# Patient Record
Sex: Female | Born: 1975 | Hispanic: No | Smoking: Never smoker
Health system: Southern US, Community
[De-identification: ages and names within clinical notes are randomized; demographics above are authoritative.]

## PROBLEM LIST (undated history)

## (undated) DIAGNOSIS — E119 Type 2 diabetes mellitus without complications: Secondary | ICD-10-CM

## (undated) DIAGNOSIS — F329 Major depressive disorder, single episode, unspecified: Secondary | ICD-10-CM

## (undated) DIAGNOSIS — K219 Gastro-esophageal reflux disease without esophagitis: Secondary | ICD-10-CM

## (undated) DIAGNOSIS — G4733 Obstructive sleep apnea (adult) (pediatric): Principal | ICD-10-CM

## (undated) DIAGNOSIS — E669 Obesity, unspecified: Secondary | ICD-10-CM

## (undated) DIAGNOSIS — G4730 Sleep apnea, unspecified: Secondary | ICD-10-CM

## (undated) DIAGNOSIS — F32A Depression, unspecified: Secondary | ICD-10-CM

## (undated) DIAGNOSIS — F419 Anxiety disorder, unspecified: Secondary | ICD-10-CM

## (undated) HISTORY — DX: Type 2 diabetes mellitus without complications: E11.9

## (undated) HISTORY — DX: Obstructive sleep apnea (adult) (pediatric): G47.33

## (undated) HISTORY — DX: Sleep apnea, unspecified: G47.30

## (undated) HISTORY — DX: Obesity, unspecified: E66.9

---

## 2013-11-30 ENCOUNTER — Encounter (INDEPENDENT_AMBULATORY_CARE_PROVIDER_SITE_OTHER): Payer: Self-pay | Admitting: Surgery

## 2013-11-30 ENCOUNTER — Ambulatory Visit (INDEPENDENT_AMBULATORY_CARE_PROVIDER_SITE_OTHER): Payer: BC Managed Care – PPO | Admitting: Surgery

## 2013-11-30 ENCOUNTER — Other Ambulatory Visit (INDEPENDENT_AMBULATORY_CARE_PROVIDER_SITE_OTHER): Payer: Self-pay

## 2013-11-30 LAB — COMPREHENSIVE METABOLIC PANEL
ALBUMIN: 3.6 g/dL (ref 3.5–5.2)
ALT: 12 U/L (ref 0–35)
AST: 14 U/L (ref 0–37)
Alkaline Phosphatase: 84 U/L (ref 39–117)
BUN: 14 mg/dL (ref 6–23)
CALCIUM: 8.7 mg/dL (ref 8.4–10.5)
CO2: 25 meq/L (ref 19–32)
Chloride: 101 mEq/L (ref 96–112)
Creat: 0.86 mg/dL (ref 0.50–1.10)
Glucose, Bld: 76 mg/dL (ref 70–99)
Potassium: 4.1 mEq/L (ref 3.5–5.3)
SODIUM: 136 meq/L (ref 135–145)
TOTAL PROTEIN: 6.8 g/dL (ref 6.0–8.3)
Total Bilirubin: 0.3 mg/dL (ref 0.2–1.2)

## 2013-11-30 LAB — CBC WITH DIFFERENTIAL/PLATELET
Basophils Absolute: 0 10*3/uL (ref 0.0–0.1)
Basophils Relative: 0 % (ref 0–1)
Eosinophils Absolute: 0.1 10*3/uL (ref 0.0–0.7)
Eosinophils Relative: 2 % (ref 0–5)
HCT: 31.1 % — ABNORMAL LOW (ref 36.0–46.0)
HEMOGLOBIN: 9.6 g/dL — AB (ref 12.0–15.0)
LYMPHS ABS: 2.1 10*3/uL (ref 0.7–4.0)
Lymphocytes Relative: 32 % (ref 12–46)
MCH: 23.4 pg — AB (ref 26.0–34.0)
MCHC: 30.9 g/dL (ref 30.0–36.0)
MCV: 75.9 fL — ABNORMAL LOW (ref 78.0–100.0)
MONOS PCT: 8 % (ref 3–12)
Monocytes Absolute: 0.5 10*3/uL (ref 0.1–1.0)
NEUTROS PCT: 58 % (ref 43–77)
Neutro Abs: 3.9 10*3/uL (ref 1.7–7.7)
Platelets: 417 10*3/uL — ABNORMAL HIGH (ref 150–400)
RBC: 4.1 MIL/uL (ref 3.87–5.11)
RDW: 18 % — ABNORMAL HIGH (ref 11.5–15.5)
WBC: 6.7 10*3/uL (ref 4.0–10.5)

## 2013-11-30 LAB — TSH: TSH: 2.508 u[IU]/mL (ref 0.350–4.500)

## 2013-11-30 NOTE — Progress Notes (Addendum)
Re:   Paige CitizenChristy J. Anderson DOB:   11-26-1975 MRN:   [id number]  ASSESSMENT AND PLAN: 1.  Morbid obesity  Initial weight - 248, BMI - 48.4  Per the 1991 NIH Consensus Statement, the patient is a candidate for bariatric surgery.  The patient attended our initial information session and reviewed the types of bariatric surgery.    The patient is interested in the Roux en Y Gastric Bypass.  I discussed with the patient the indications and risks of bariatric surgery.  The potential risks of surgery include, but are not limited to, bleeding, infection, leak from the bowel, DVT and PE, open surgery, long term nutrition consequences, and death.  The patient understands the importance of compliance and long term follow-up with our group after surgery.  From here we will obtain lab tests, x-rays, nutrition consult, and psych consult. She has a good handle on this because of her mother.  2.  GERD 3.  DM x 1 year  HgbA1C - 6.6 on 08/08/2013 4.  Possible sleep apnea. 5.  Colonic diverticula on CT scan of 07/01/2102 [Has severe sleep apnea - saw Dr. Craige CottaSood on 02/01/2014 - for CPAP  DN 02/04/2014]  Chief Complaint  Patient presents with  . Bariatric Pre-op   REFERRING PHYSICIAN: Bosie ClosICE,Paige M, MD  HISTORY OF PRESENT ILLNESS: Paige Anderson is a 38 y.o. (DOB: 11-26-1975)  white  female whose primary care physician is Bosie ClosICE,Paige M, MD and comes to me today for weight loss surgery. She comes with her mother, Paige Anderson (#[id number], a RYGB patient of mine).  I did her mother's surgery just about 1 month ago.  Ms. Vale HavenBarney has been to an information session that I spoke at.  She has tried multiple diets: Weight Watchers, Slim fast, and low calorie. She has had success with Weight Watchers losing 40 pounds, but then she "fell off the wagon". She's also tried a diet pill that started with the letter D. Her recent diagnosis of diabetes and her mother's surgery has made her think about the surgery.   No  past medical history on file.   No past surgical history on file.    Current Outpatient Prescriptions  Medication Sig Dispense Refill  . omeprazole (PRILOSEC) 40 MG capsule Take 40 mg by mouth daily.      . metFORMIN (GLUCOPHAGE) 500 MG tablet Take by mouth 2 (two) times daily with a meal.       No current facility-administered medications for this visit.      Allergies  Allergen Reactions  . Aspirin Rash    REVIEW OF SYSTEMS: Skin:  No history of rash.  No history of abnormal moles. Infection:  No history of hepatitis or HIV.  No history of MRSA. Neurologic:  No history of stroke.  No history of seizure.  No history of headaches. Cardiac:  No history of hypertension. No history of heart disease.   No history of seeing a cardiologist. Pulmonary:  Possible sleep apnea.  Endocrine:  DM x 1 year. HgbA1C - 6.6 on 08/08/2013.    No thyroid disease. Gastrointestinal:  GERD.   No history of liver disease.  No history of gall bladder disease.  No history of pancreas disease.  Colonic diverticula on CT scan of 07/01/2102. Urologic:  No history of kidney stones.  No history of bladder infections. Musculoskeletal:  No history of joint or back disease. Hematologic:  No bleeding disorder.  No history of anemia.  Not anticoagulated. Psycho-social:  The patient  is oriented.   The patient has no obvious psychologic or social impairment to understanding our conversation and plan.  SOCIAL and FAMILY HISTORY: Divorced She comes with her mother, Paige Loron [phone], a RYGB patient of mine) Works with insurance, [employer] Has 2 children, 1 yo daughter and 65 yo son  PHYSICAL EXAM: BP 128/76  Pulse 62  Temp(Src) 98 F (36.7 C)  Resp 18  Ht 5' (1.524 m)  Wt 248 lb (112.492 kg)  BMI 48.43 kg/m2  General: WN WF who is alert and generally healthy appearing.  HEENT: Normal. Pupils equal. Neck: Supple. No mass.  No thyroid mass. Lymph Nodes:  No supraclavicular or cervical  nodes. Lungs: Clear to auscultation and symmetric breath sounds. Heart:  RRR. No murmur or rub. Abdomen: Soft. No mass. No tenderness. No hernia. Normal bowel sounds.  Pfannenstiel scar.  She is more apple than pear. Rectal: No mass.  Guaiac neg stool. Extremities:  Good strength and ROM  in upper and lower extremities. Neurologic:  Grossly intact to motor and sensory function. Psychiatric: Has normal mood and affect. Behavior is normal.   DATA REVIEWED: Notes from Dr. Kathi Ludwig, MD,  [facility] [address] [place]  [address]   [place], [place]    [address] Phone:  [phone] FAX:  [phone]

## 2013-11-30 NOTE — Patient Instructions (Signed)
Congratulations on starting your journey to a healthier life! Over the next few weeks you will be undergoing tests (x-rays and labs) and seeing specialists to help evaluate you for weight loss surgery.  These tests and consultations with a psychologist and nutritionist are needed to prepare you for the lifestyle changes that lie ahead and are often required by insurance companies to approve you for surgery.   Pathway to Surgery:  Over the next few weeks -->Lab work -->Radiology tests   - Chest x-ray - make sure your lungs are normal before surgery  - Upper GI - you drink barium and pictures are taken as it travels down your  esophagus and into your stomach - looks for reflux and a hiatal hernia which may  need to repaired at the same time as your weight loss surgery  - Abdominal Ultrasound - looks at your gallbladder and liver  - Mammogram - up to date mammogram if you are a female -->EKG  -->Sleep study - if you are felt to be at high risk for obstructive sleep apnea -->H. Pylori breath test (BreathTek) - you surgeon may order this test to see if you have  a bacteria (H pylori) in your stomach which makes you at higher risk to develop a  ulcer or inflammation of your stomach -->Nutrition consultation -->Psychologist consultation -->Other specialist consults - your surgeon may determine that you need to see a specialist like a cardiologist or pulmnologist depending on your health history -->Watch EMMI video about your planned weight loss surgery -->you can look at [email or web address] to learn more about weight loss surgery and  compare surgery outcomes -->you can look at our new website - [email or web address] - available mid-April 2015  Two weeks prior to surgery  Go on the extremely low carb liquid diet - this will decrease the size of your liver  which will make surgery safer - the nutritionist will go over this at a later date  Attend preoperative appointment with your surgeon  Attend  preoperative surgery class  One week prior to surgery  No aspirin products.  Tylenol is acceptable   24 hours prior to surgery  No alcoholic beverages  Report fever greater than 100.5 or excessive nasal drainage suggesting infection  Continue bariatric preop diet  Perform bowel prep  Do not eat or drink anything after midnight the night before surgery  Do not take any medications except those instructed by the anesthesiologist  Morning of surgery  Please arrive at the hospital at least 2 hours before your scheduled surgery time.  No makeup, fingernail polish or jewelry  Bring insurance cards with you  Bring your CPAP mask if you use this   

## 2013-12-27 ENCOUNTER — Other Ambulatory Visit: Payer: Self-pay

## 2013-12-27 ENCOUNTER — Ambulatory Visit (HOSPITAL_COMMUNITY)
Admission: RE | Admit: 2013-12-27 | Discharge: 2013-12-27 | Disposition: A | Discharge status: Home / Self Care | Payer: BC Managed Care – PPO | Source: Ambulatory Visit | Attending: Surgery | Admitting: Surgery

## 2013-12-27 DIAGNOSIS — K5730 Diverticulosis of large intestine without perforation or abscess without bleeding: Secondary | ICD-10-CM | POA: Insufficient documentation

## 2013-12-27 DIAGNOSIS — Q619 Cystic kidney disease, unspecified: Secondary | ICD-10-CM | POA: Insufficient documentation

## 2013-12-27 DIAGNOSIS — K7689 Other specified diseases of liver: Secondary | ICD-10-CM | POA: Insufficient documentation

## 2013-12-27 DIAGNOSIS — Z6841 Body Mass Index (BMI) 40.0 and over, adult: Secondary | ICD-10-CM | POA: Insufficient documentation

## 2013-12-27 DIAGNOSIS — Q6101 Congenital single renal cyst: Secondary | ICD-10-CM | POA: Insufficient documentation

## 2013-12-27 DIAGNOSIS — E119 Type 2 diabetes mellitus without complications: Secondary | ICD-10-CM | POA: Insufficient documentation

## 2013-12-27 DIAGNOSIS — K219 Gastro-esophageal reflux disease without esophagitis: Secondary | ICD-10-CM | POA: Insufficient documentation

## 2014-01-01 ENCOUNTER — Encounter (HOSPITAL_COMMUNITY)
Admission: RE | Disposition: A | Discharge status: Home / Self Care | Payer: Self-pay | Source: Ambulatory Visit | Attending: Surgery

## 2014-01-01 ENCOUNTER — Ambulatory Visit (HOSPITAL_COMMUNITY)
Admission: RE | Admit: 2014-01-01 | Discharge: 2014-01-01 | Disposition: A | Discharge status: Home / Self Care | Payer: BC Managed Care – PPO | Source: Ambulatory Visit | Attending: Surgery | Admitting: Surgery

## 2014-01-01 HISTORY — PX: BREATH TEK H PYLORI: SHX5422

## 2014-01-01 SURGERY — BREATH TEST, FOR HELICOBACTER PYLORI

## 2014-01-01 NOTE — Progress Notes (Signed)
01/01/14 [id number]  BREATH TEK ASSESSMENT  Referring MD Ovidio Kinavid Newman  Time of Last PO Intake 2130 (on 12/31/2013)  Baseline Breath At: 0805  Pranactin Given At: 0810  Post-Dose Breath At: 0825  Sample 1 3.5  Sample 2 3.0  Test Negative

## 2014-01-01 NOTE — Progress Notes (Signed)
01/01/14 0903  BREATH TEK ASSESSMENT  Referring MD David Newman  Time of Last PO Intake 2130 (on 12/31/2013)  Baseline Breath At: 0805  Pranactin Given At: 0810  Post-Dose Breath At: 0825  Sample 1 3.5  Sample 2 3.0  Test Negative   

## 2014-01-02 ENCOUNTER — Encounter (HOSPITAL_COMMUNITY): Payer: Self-pay | Admitting: Surgery

## 2014-01-04 ENCOUNTER — Encounter (HOSPITAL_BASED_OUTPATIENT_CLINIC_OR_DEPARTMENT_OTHER): Payer: Self-pay

## 2014-01-09 ENCOUNTER — Encounter: Payer: BC Managed Care – PPO | Attending: Surgery | Admitting: Dietician

## 2014-01-09 ENCOUNTER — Encounter: Payer: Self-pay | Admitting: Dietician

## 2014-01-09 DIAGNOSIS — Z01818 Encounter for other preprocedural examination: Secondary | ICD-10-CM | POA: Diagnosis present

## 2014-01-09 DIAGNOSIS — Z6841 Body Mass Index (BMI) 40.0 and over, adult: Secondary | ICD-10-CM | POA: Diagnosis not present

## 2014-01-09 DIAGNOSIS — Z713 Dietary counseling and surveillance: Secondary | ICD-10-CM | POA: Insufficient documentation

## 2014-01-09 NOTE — Progress Notes (Signed)
  Pre-Op Assessment Visit:  Pre-Operative RYGB Surgery  Medical Nutrition Therapy:  Appt start time: 1045   End time:  1115.  Patient was seen on 01/09/2014 for Pre-Operative RYGB Nutrition Assessment. Assessment and [facility] on 01/09/2014.   Preferred Learning Style:   No preference indicated   Learning Readiness:   Ready  Handouts given during visit include:  Pre-Op Goals Bariatric Surgery Protein Shakes  Teaching Method Utilized:  Visual Auditory  Barriers to learning/adherence to lifestyle change: none  Demonstrated degree of understanding via:  Teach Back   Patient to call the [facility] to enroll in Pre-Op and Post-Op Nutrition Education when surgery date is scheduled.

## 2014-01-23 ENCOUNTER — Encounter (HOSPITAL_BASED_OUTPATIENT_CLINIC_OR_DEPARTMENT_OTHER): Payer: Self-pay

## 2014-01-23 ENCOUNTER — Ambulatory Visit (HOSPITAL_BASED_OUTPATIENT_CLINIC_OR_DEPARTMENT_OTHER): Payer: BC Managed Care – PPO | Attending: Surgery | Admitting: Radiology

## 2014-01-23 VITALS — Ht 60.0 in | Wt 250.0 lb

## 2014-01-23 DIAGNOSIS — R0609 Other forms of dyspnea: Secondary | ICD-10-CM | POA: Diagnosis not present

## 2014-01-23 DIAGNOSIS — G4733 Obstructive sleep apnea (adult) (pediatric): Secondary | ICD-10-CM | POA: Diagnosis not present

## 2014-01-23 DIAGNOSIS — R0989 Other specified symptoms and signs involving the circulatory and respiratory systems: Secondary | ICD-10-CM | POA: Insufficient documentation

## 2014-01-27 DIAGNOSIS — G4733 Obstructive sleep apnea (adult) (pediatric): Secondary | ICD-10-CM

## 2014-01-27 NOTE — Sleep Study (Signed)
   NAME: Paige Anderson DATE OF BIRTH:  1976/02/01 MEDICAL RECORD NUMBER [id number]  LOCATION: [facility]  PHYSICIAN: Doniven Vanpatten D  DATE OF STUDY: 01/23/2014  SLEEP STUDY TYPE: Nocturnal Polysomnogram               REFERRING PHYSICIAN: Ovidio Kin, MD  INDICATION FOR STUDY: Hypersomnia with sleep apnea  EPWORTH SLEEPINESS SCORE:  17/24 HEIGHT: 5' (152.4 cm)  WEIGHT: 250 lb (113.399 kg)    Body mass index is 48.82 kg/(m^2).  NECK SIZE: 19 in.  MEDICATIONS: Charted for review  SLEEP ARCHITECTURE: Total sleep time 338.5 minutes with sleep efficiency 86.5%. Stage I was 4.7%, stage II 76.5%, stage III 0.4%, REM 18.3% of total sleep time. Sleep latency 31.5 minutes, REM latency 158.5 minutes, awake after sleep onset 21.5 minutes, arousal index 40.6, bedtime medication: None  RESPIRATORY DATA: Apnea hypopneas index (AHI) 90.8 per hour. 512 events scored including 93 obstructive apneas and 419 hypopneas. Most events were while nonsupine. REM AHI 91 per hour. This study was ordered as a diagnostic polysomnogram without CPAP.  OXYGEN DATA: Moderate snoring with oxygen desaturation to a nadir of 34% and mean saturation 87% on room air. Room air oxygen saturation on arrival while awake was 99%.  CARDIAC DATA: Sinus rhythm with PACs and PVCs  MOVEMENT/PARASOMNIA: No significant movement disturbance, no bathroom trips  IMPRESSION/ RECOMMENDATION:   1) Severe obstructive sleep apnea/hypopneas syndrome, AHI 90.8 per hour with nonsupine events. REM AHI 91 per hour. Moderate snoring with oxygen desaturation to a nadir of 34% and mean saturation 87% on room air. 2) This study was ordered as a diagnostic polysomnogram. Consider return for a dedicated CPAP titration study, which would also allow assessment of oxygenation response to CPAP.   Waymon Budge Diplomate, American Board of Sleep Medicine  ELECTRONICALLY SIGNED ON:  01/27/2014, 11:11 AM [facility] PH: [phone]   FX: [phone] ACCREDITED BY THE AMERICAN ACADEMY OF SLEEP MEDICINE

## 2014-02-01 ENCOUNTER — Encounter: Payer: Self-pay | Admitting: Pulmonary Disease

## 2014-02-01 ENCOUNTER — Ambulatory Visit (INDEPENDENT_AMBULATORY_CARE_PROVIDER_SITE_OTHER): Payer: BC Managed Care – PPO | Admitting: Pulmonary Disease

## 2014-02-01 VITALS — BP 118/72 | HR 96 | Ht 60.0 in | Wt 260.2 lb

## 2014-02-01 DIAGNOSIS — G4733 Obstructive sleep apnea (adult) (pediatric): Secondary | ICD-10-CM | POA: Insufficient documentation

## 2014-02-01 HISTORY — DX: Obstructive sleep apnea (adult) (pediatric): G47.33

## 2014-02-01 NOTE — Assessment & Plan Note (Signed)
She has severe OSA with significant oxygen desaturation.  I have reviewed the recent sleep study results with the patient.  We discussed how sleep apnea can affect various health problems including risks for hypertension, cardiovascular disease, and diabetes.  We also discussed how sleep disruption can increase risks for accident, such as while driving.  Weight loss as a means of improving sleep apnea was also reviewed.  Additional treatment options discussed were CPAP therapy, oral appliance, and surgical intervention.  Will arrange for in lab titration study.  Will then determine if CPAP is okay, or if she needs BiPAP +/- supplemental oxygen.

## 2014-02-01 NOTE — Patient Instructions (Signed)
Will arrange for CPAP titration study Follow up in 3 to 4 months

## 2014-02-01 NOTE — Progress Notes (Signed)
Chief Complaint  Patient presents with  . SLEEP CONSULT    Referred by Dr Ezzard Standing. Epworth Score: 17    History of Present Illness: Paige Anderson is a 38 y.o. female for evaluation of sleep problems.  She is being assessed for bariatric surgery.  There was concern she could have sleep apnea.  This was confirmed with sleep study.  She snores, and will stop breathing while asleep.  She will wake up with a gasp.  She is tired all the time, and can fall asleep whenever she is sitting quiet.  She goes to sleep at 10 pm.  She falls asleep after 15 minutes.  She wakes up 2 to 3 times to use the bathroom.  She gets out of bed at 9.  She feels tire in the morning.  She denies morning headache.  She does not use anything to help her fall sleep.  She drinks soda all day long.  She denies sleep walking, sleep talking, bruxism, or nightmares.  There is no history of restless legs.  She denies sleep hallucinations, sleep paralysis, or cataplexy.  She will get leg cramps.  The Epworth score is 17 out of 24.  Tests: PSG 01/23/14 >> AHI 90.8, SpO2 low 34%  Paige Anderson  has a past medical history of Sleep apnea; Obesity; and Diabetes mellitus without complication.  Paige Anderson  has past surgical history that includes Breath tek h pylori (N/A, 01/01/2014) and Cesarean section.  Prior to Admission medications   Medication Sig Start Date End Date Taking? Authorizing Provider  metFORMIN (GLUCOPHAGE) 500 MG tablet Take 500 mg by mouth daily with breakfast.    Yes Historical Provider, MD  omeprazole (PRILOSEC) 40 MG capsule Take 40 mg by mouth daily.   Yes Historical Provider, MD    Allergies  Allergen Reactions  . Aspirin Rash    Her family history includes Breast cancer in her mother; Diabetes in her other; Obesity in her other.  She  reports that she has never smoked. She does not have any smokeless tobacco history on file. She reports that she drinks alcohol. She reports that she does  not use illicit drugs. Review of Systems  Constitutional: Negative for fever and unexpected weight change.  HENT: Positive for congestion and sneezing. Negative for dental problem, ear pain, nosebleeds, postnasal drip, rhinorrhea, sinus pressure, sore throat and trouble swallowing.   Eyes: Negative for redness and itching.  Respiratory: Positive for cough and shortness of breath. Negative for chest tightness and wheezing.   Cardiovascular: Negative for palpitations and leg swelling.  Gastrointestinal: Negative for nausea and vomiting.  Genitourinary: Negative for dysuria.  Musculoskeletal: Positive for arthralgias and joint swelling.  Skin: Positive for rash.  Neurological: Positive for headaches.  Hematological: Does not bruise/bleed easily.  Psychiatric/Behavioral: Negative for dysphoric mood. The patient is nervous/anxious.     Physical Exam:  General - No distress ENT - No sinus tenderness, no oral exudate, no LAN, no thyromegaly, TM clear, pupils equal/reactive, MP 4, enlarged tongue Cardiac - s1s2 regular, no murmur, pulses symmetric Chest - No wheeze/rales/dullness, good air entry, normal respiratory excursion Back - No focal tenderness Abd - Soft, non-tender, no organomegaly, + bowel sounds Ext - No edema Neuro - Normal strength, cranial nerves intact Skin - No rashes Psych - Normal mood, and behavior  Assessment/plan:  Coralyn Helling, M.D. Pager [phone]

## 2014-02-01 NOTE — Progress Notes (Deleted)
   Subjective:    Patient ID: Paige Anderson, female    DOB: [birth year], 38 y.o.   MRN: [id number]  HPI    Review of Systems  Constitutional: Negative for fever and unexpected weight change.  HENT: Positive for congestion and sneezing. Negative for dental problem, ear pain, nosebleeds, postnasal drip, rhinorrhea, sinus pressure, sore throat and trouble swallowing.   Eyes: Negative for redness and itching.  Respiratory: Positive for cough and shortness of breath. Negative for chest tightness and wheezing.   Cardiovascular: Negative for palpitations and leg swelling.  Gastrointestinal: Negative for nausea and vomiting.  Genitourinary: Negative for dysuria.  Musculoskeletal: Positive for arthralgias and joint swelling.  Skin: Positive for rash.  Neurological: Positive for headaches.  Hematological: Does not bruise/bleed easily.  Psychiatric/Behavioral: Negative for dysphoric mood. The patient is nervous/anxious.        Objective:   Physical Exam        Assessment & Plan:

## 2014-02-02 ENCOUNTER — Encounter (HOSPITAL_BASED_OUTPATIENT_CLINIC_OR_DEPARTMENT_OTHER): Payer: BC Managed Care – PPO

## 2014-02-04 NOTE — Addendum Note (Signed)
Addended by: Kandis Cocking on: 02/04/2014 02:24 PM   Modules accepted: Orders

## 2014-02-26 ENCOUNTER — Encounter: Payer: BC Managed Care – PPO | Attending: Surgery

## 2014-02-26 DIAGNOSIS — E119 Type 2 diabetes mellitus without complications: Secondary | ICD-10-CM | POA: Diagnosis not present

## 2014-02-26 DIAGNOSIS — Z713 Dietary counseling and surveillance: Secondary | ICD-10-CM | POA: Insufficient documentation

## 2014-02-26 NOTE — Progress Notes (Signed)
  Pre-Operative Nutrition Class:  Appt start time: 830   End time:  930.  Patient was seen on 02/26/2014 for Pre-Operative Bariatric Surgery Education at [facility].   Surgery date: 03/19/2014 Surgery type: RYGB [facility] weight at [facility]: 252 lbs on 01/09/2014 Weight today: 250.0 lbs  TANITA  BODY COMP RESULTS  02/26/14   BMI (kg/m^2) 48.8   Fat Mass (lbs) 128.0   Fat Free Mass (lbs) 122.0   Total Body Water (lbs) 89.5   Samples given per MNT protocol. Patient educated on appropriate usage: Bariatric Advantage Multivitamin Lot # [id number] Exp:12/2013  Bariatric Advantage Calcium Citrate Chocolate Lot # [id number] Exp: 10/2014  Bariatric Advantage Peppermint B12 Lot # [id number] Exp: 04/2014  Renee Pain Protein Powder Vanilla Lot # [id number] Exp: 02/2015  Premier Protein Chocolate Lot # [id number] Exp: 01/28/2015  The following the learning objectives were met by the patient during this course:  Identify Pre-Op Dietary Goals and will begin 2 weeks pre-operatively  Identify appropriate sources of fluids and proteins   State protein recommendations and appropriate sources pre and post-operatively  Identify Post-Operative Dietary Goals and will follow for 2 weeks post-operatively  Identify appropriate multivitamin and calcium sources  Describe the need for physical activity post-operatively and will follow MD recommendations  State when to call healthcare provider regarding medication questions or post-operative complications  Handouts given during class include:  Pre-Op Bariatric Surgery Diet Handout  Protein Shake Handout  Post-Op Bariatric Surgery Nutrition Handout  BELT Program Information Flyer  Support Group Information Flyer  [facility] Bariatric Supplements Price List  Follow-Up Plan: Patient will follow-up at [facility] 2 weeks post operatively for diet advancement per MD.

## 2014-02-26 NOTE — Patient Instructions (Signed)
Follow:   Pre-Op Diet per MD 2 weeks prior to surgery  Phase 2- Liquids (clear/full) 2 weeks after surgery  Vitamin/Mineral/Calcium guidelines for purchasing bariatric supplements  Exercise guidelines pre and post-op per MD  Follow-up at [facility] in 2 weeks post-op for diet advancement. Contact Leslie Williams or Liz Davari Lopes as needed with questions/concerns. 

## 2014-03-02 ENCOUNTER — Ambulatory Visit (HOSPITAL_BASED_OUTPATIENT_CLINIC_OR_DEPARTMENT_OTHER): Payer: BC Managed Care – PPO | Attending: Pulmonary Disease

## 2014-03-02 DIAGNOSIS — G4710 Hypersomnia, unspecified: Secondary | ICD-10-CM

## 2014-03-02 DIAGNOSIS — G4769 Other sleep related movement disorders: Secondary | ICD-10-CM | POA: Diagnosis not present

## 2014-03-02 DIAGNOSIS — G4730 Sleep apnea, unspecified: Secondary | ICD-10-CM | POA: Insufficient documentation

## 2014-03-02 DIAGNOSIS — G4733 Obstructive sleep apnea (adult) (pediatric): Secondary | ICD-10-CM

## 2014-03-02 DIAGNOSIS — Z79899 Other long term (current) drug therapy: Secondary | ICD-10-CM | POA: Diagnosis not present

## 2014-03-06 ENCOUNTER — Telehealth: Payer: Self-pay | Admitting: Pulmonary Disease

## 2014-03-06 DIAGNOSIS — G4733 Obstructive sleep apnea (adult) (pediatric): Secondary | ICD-10-CM

## 2014-03-06 NOTE — Telephone Encounter (Signed)
CPAP titration 03/02/14 >> CPAP 9 cm H2O >> AHI 5.6, +R, +S.  Will have my nurse inform pt that she did very well with CPAP during sleep study.  I have sent order for her to start CPAP 9 cm H2O.  She will need ROV two months after CPAP set up.

## 2014-03-06 NOTE — Sleep Study (Signed)
[facility]  NAME: Paige CitizenChristy J. Sethi DATE OF BIRTH:  1976-04-11 MEDICAL RECORD NUMBER [id number]  LOCATION: [facility]  PHYSICIAN: Coralyn HellingVineet Akua Blethen, M.D. DATE OF STUDY: 03/02/2014  SLEEP STUDY TYPE: CPAP titration               REFERRING PHYSICIAN: Coralyn HellingSood, Illiana Losurdo, MD  INDICATION FOR STUDY:  Paige Anderson is a [age] y.o. female with severe sleep apnea.  She had sleep study 01/23/14 with AHI 90.8, and SaO2 low of 34%.  She returns to sleep lab for titration study.  EPWORTH SLEEPINESS SCORE: 2. HEIGHT: 5\' 0"   WEIGHT: 250 lbs  BMI: 49    NECK SIZE: 19 in.  MEDICATIONS:  Current Outpatient Prescriptions on File Prior to Visit  Medication Sig Dispense Refill  . metFORMIN (GLUCOPHAGE) 500 MG tablet Take 500 mg by mouth daily with breakfast.       . omeprazole (PRILOSEC) 40 MG capsule Take 40 mg by mouth daily.       No current facility-administered medications on file prior to visit.    SLEEP ARCHITECTURE:  Total recording time: 383.5 minutes.  Total sleep time was: 344.5 minutes.  Sleep efficiency: 89.8%.  Sleep latency: 16 minutes.  REM latency: 101.5 minutes.  Stage N1: 5.4%.  Stage N2: 29.5%.  Stage N3: 20.8%.  Stage R:  44.4%.  Supine sleep: 323 minutes.  Non-supine sleep: 21.5 minutes.  CARDIAC DATA:  Average heart rate: 78 beats per minute. Rhythm strip: sinus rhythm with PVC's.  RESPIRATORY DATA: Average respiratory rate: 19.  She was started on CPAP 5 and increased to 11 cm H2O.  With CPAP at 9 cm H2O her AHI was reduced to 5.6.  At this pressure she was observed in REM and supine sleep.  MOVEMENT/PARASOMNIA:  Periodic limb movement: 90.4.  Period limb movements with arousals: 1.7. Restroom trips: none.  OXYGEN DATA:  Baseline oxygenation: 98%. Lowest SaO2: 72%. Time spent below SaO2 90%: 4.5 minutes. Supplemental oxygen used: none.  IMPRESSION/ RECOMMENDATION:   This was a successful CPAP titration study.  She did well  with CPAP 9 cm H2O.  She was fitted with a Fisher Paykel Pilairo mask.  She had an increase in her periodic limb movement index, and clinical correlation would be needed to determine the significance of this.  Coralyn HellingVineet Liesel Peckenpaugh, M.D. Diplomate, Biomedical engineerAmerican Board of Sleep Medicine  ELECTRONICALLY SIGNED ON:  03/06/2014, 3:39 PM  [facility] PH: [phone]   FX: [phone] ACCREDITED BY THE AMERICAN ACADEMY OF SLEEP MEDICINE

## 2014-03-07 NOTE — Telephone Encounter (Signed)
ATC NA several rings- automated message stating person is unavailable. wcb

## 2014-03-08 NOTE — Telephone Encounter (Signed)
Pt returned call [phone]

## 2014-03-08 NOTE — Telephone Encounter (Signed)
Results have been explained to patient, pt expressed understanding.  Appt scheduled with VS 05/08/14 at 9am-- 68mo CPAP f/u Nothing further needed.

## 2014-03-12 ENCOUNTER — Encounter (HOSPITAL_COMMUNITY): Payer: Self-pay | Admitting: Pharmacy Technician

## 2014-03-14 ENCOUNTER — Telehealth: Payer: Self-pay | Admitting: Pulmonary Disease

## 2014-03-14 NOTE — Telephone Encounter (Signed)
Pt states that her current DME [facility] is giving her the "run around" Pt states that her surgery is scheduled for Monday and they keep cancelling set up of CPAP.  Pt states that she was advised by DME that they could see her on Friday (hopefully if not still overbooked) and set up CPAP Pt states that she was advised by her surgeon that if CPAP not set up and if patient is not using CPAP @ home by Monday, surgery will be cancelled. Pt wanting to switch DME companies--is it too late to switch DME, should she trust that they will get her the CPAP machine by Friday as advised?  Please advise Dr Craige CottaSood. Thanks.

## 2014-03-15 NOTE — Telephone Encounter (Signed)
[facility] Thanks.

## 2014-03-15 NOTE — Telephone Encounter (Signed)
Please have [facility] f/u with her DME.  If they can not guarantee that her CPAP will be set up by Friday 03/16/14, then please have [facility] change order for her DME to different company to arrange for CPAP set up.

## 2014-03-15 NOTE — Telephone Encounter (Signed)
Called [facility] and they had already closed for the day. Will call back first thing in the morning. Rhonda J Cobb

## 2014-03-15 NOTE — Patient Instructions (Addendum)
Paige Anderson  03/15/2014   Your procedure is scheduled on:  03/19/2014    Report to [facility] Main Entr[facility].  Follow the Signs to Short Stay Center at   0515     am  Call this number if you have problems the morning of surgery: [phone]   Remember:   Bring CPAP mask and tubing    Do not eat food or drink liquids after midnight.   Take these medicines the morning of surgery with A SIP OF WATER: prilosec, flonase nasal spray , lexapro    Do not wear jewelry, make-up or nail polish.  Do not wear lotions, powders, or perfumes. deodorant.  Do not shave 48 hours prior to surgery.   Do not bring valuables to the hospital.  Contacts, dentures or bridgework may not be worn into surgery.  Leave suitcase in the car. After surgery it may be brought to your room.  For patients admitted to the hospital, checkout time is 11:00 AM the day of  discharge.         Please read over the following fact sheets that you were given: , coughing and deep breathing exercises, leg exercises            [facility] - Preparing for Surgery Before surgery, you can play an important role.  Because skin is not sterile, your skin needs to be as free of germs as possible.  You can reduce the number of germs on your skin by washing with CHG (chlorahexidine gluconate) soap before surgery.  CHG is an antiseptic cleaner which kills germs and bonds with the skin to continue killing germs even after washing. Please DO NOT use if you have an allergy to CHG or antibacterial soaps.  If your skin becomes reddened/irritated stop using the CHG and inform your nurse when you arrive at Short Stay. Do not shave (including legs and underarms) for at least 48 hours prior to the first CHG shower.  You may shave your face/neck. Please follow these instructions carefully:  1.  Shower with CHG Soap the night before surgery and the  morning of Surgery.  2.  If you choose to wash your hair, wash your hair first as usual with your   normal  shampoo.  3.  After you shampoo, rinse your hair and body thoroughly to remove the  shampoo.                           4.  Use CHG as you would any other liquid soap.  You can apply chg directly  to the skin and wash                       Gently with a scrungie or clean washcloth.  5.  Apply the CHG Soap to your body ONLY FROM THE NECK DOWN.   Do not use on face/ open                           Wound or open sores. Avoid contact with eyes, ears mouth and genitals (private parts).                       Wash face,  Genitals (private parts) with your normal soap.             6.  Wash thoroughly, paying special attention to the  area where your surgery  will be performed.  7.  Thoroughly rinse your body with warm water from the neck down.  8.  DO NOT shower/wash with your normal soap after using and rinsing off  the CHG Soap.                9.  Pat yourself dry with a clean towel.            10.  Wear clean pajamas.            11.  Place clean sheets on your bed the night of your first shower and do not  sleep with pets. Day of Surgery : Do not apply any lotions/deodorants the morning of surgery.  Please wear clean clothes to the hospital/surgery center.  FAILURE TO FOLLOW THESE INSTRUCTIONS MAY RESULT IN THE CANCELLATION OF YOUR SURGERY PATIENT SIGNATURE_________________________________  NURSE SIGNATURE__________________________________  ________________________________________________________________________

## 2014-03-16 ENCOUNTER — Encounter (HOSPITAL_COMMUNITY): Payer: Self-pay

## 2014-03-16 ENCOUNTER — Encounter (HOSPITAL_COMMUNITY)
Admission: RE | Admit: 2014-03-16 | Discharge: 2014-03-16 | Disposition: A | Discharge status: Home / Self Care | Payer: BC Managed Care – PPO | Source: Ambulatory Visit | Attending: Surgery | Admitting: Surgery

## 2014-03-16 HISTORY — DX: Anxiety disorder, unspecified: F41.9

## 2014-03-16 HISTORY — DX: Depression, unspecified: F32.A

## 2014-03-16 HISTORY — DX: Gastro-esophageal reflux disease without esophagitis: K21.9

## 2014-03-16 HISTORY — DX: Major depressive disorder, single episode, unspecified: F32.9

## 2014-03-16 LAB — CBC WITH DIFFERENTIAL/PLATELET
Basophils Absolute: 0 10*3/uL (ref 0.0–0.1)
Basophils Relative: 0 % (ref 0–1)
EOS PCT: 4 % (ref 0–5)
Eosinophils Absolute: 0.3 10*3/uL (ref 0.0–0.7)
HCT: 32.4 % — ABNORMAL LOW (ref 36.0–46.0)
Hemoglobin: 9.6 g/dL — ABNORMAL LOW (ref 12.0–15.0)
LYMPHS PCT: 32 % (ref 12–46)
Lymphs Abs: 2.6 10*3/uL (ref 0.7–4.0)
MCH: 22.9 pg — ABNORMAL LOW (ref 26.0–34.0)
MCHC: 29.6 g/dL — ABNORMAL LOW (ref 30.0–36.0)
MCV: 77.1 fL — AB (ref 78.0–100.0)
Monocytes Absolute: 0.6 10*3/uL (ref 0.1–1.0)
Monocytes Relative: 8 % (ref 3–12)
NEUTROS PCT: 56 % (ref 43–77)
Neutro Abs: 4.6 10*3/uL (ref 1.7–7.7)
PLATELETS: 416 10*3/uL — AB (ref 150–400)
RBC: 4.2 MIL/uL (ref 3.87–5.11)
RDW: 16.3 % — ABNORMAL HIGH (ref 11.5–15.5)
WBC: 8.1 10*3/uL (ref 4.0–10.5)

## 2014-03-16 LAB — COMPREHENSIVE METABOLIC PANEL
ALT: 14 U/L (ref 0–35)
AST: 13 U/L (ref 0–37)
Albumin: 3.4 g/dL — ABNORMAL LOW (ref 3.5–5.2)
Alkaline Phosphatase: 90 U/L (ref 39–117)
Anion gap: 14 (ref 5–15)
BILIRUBIN TOTAL: 0.2 mg/dL — AB (ref 0.3–1.2)
BUN: 19 mg/dL (ref 6–23)
CHLORIDE: 98 meq/L (ref 96–112)
CO2: 25 meq/L (ref 19–32)
Calcium: 9.3 mg/dL (ref 8.4–10.5)
Creatinine, Ser: 1.04 mg/dL (ref 0.50–1.10)
GFR calc Af Amer: 77 mL/min — ABNORMAL LOW (ref >90)
GFR, EST NON AFRICAN AMERICAN: 66 mL/min — AB (ref >90)
Glucose, Bld: 88 mg/dL (ref 70–99)
POTASSIUM: 4 meq/L (ref 3.7–5.3)
SODIUM: 137 meq/L (ref 137–147)
Total Protein: 8 g/dL (ref 6.0–8.3)

## 2014-03-16 NOTE — Telephone Encounter (Signed)
Attempted to contact patient again. No answer and unable to leave message. Rhonda J Cobb

## 2014-03-16 NOTE — Telephone Encounter (Signed)
Called [facility] and spoke with Taneshia. She stated that she advised the patient that she needed the face to face notes from the physician that ordered the study and the patient was going to have them faxed to them. I advised Delton Seeaneshia that if she needed documents such as this, she needed to call us and not the patient. The face to face notes are in epic. [facility] and advised Taneshia that I needed to know that patient would be set up today as she has to have CPAP today.  If [facility] can't get this arranged I need to know so I can have another DME set pt up. Advised Delton Seeaneshia not to ever contact our patients to track down paperwork or needed information, that she needs to call our office if there is any problems if processing an order.  Attempted to contact patient and did not get an answer and was unable to leave a voice mail.  I will f/u with [facility] before lunch.

## 2014-03-16 NOTE — Progress Notes (Signed)
CBC results faxed via EPIC to Dr Ovidio Kinavid Newman.

## 2014-03-16 NOTE — Telephone Encounter (Signed)
Called and spoke with patient who stated that after all the below that took place this morning. [facility] called and stated that she would be unable to be set up today on CPAP. Called and spoke with Marchelle FolksAmanda at [facility] and [facility] will be able to set pt up today at 2:00 pm. Marchelle FolksAmanda with [facility] called patient and gave her appointment time and location of their office. [facility] and [facility]. Pt is aware of appointment and grateful for all the help that has been provided.  Nothing else needed at this time. Pt will obtain her cpap today with [facility]. Rhonda J Cobb

## 2014-03-16 NOTE — Progress Notes (Signed)
EKG_8/5/15 EPIC  DR Craige CottaSood- 02/01/14 EPIC  Sleep Study Documents- 03/12/14 EPIC

## 2014-03-18 ENCOUNTER — Ambulatory Visit (INDEPENDENT_AMBULATORY_CARE_PROVIDER_SITE_OTHER): Payer: Self-pay | Admitting: Surgery

## 2014-03-18 NOTE — Anesthesia Preprocedure Evaluation (Signed)
Anesthesia Evaluation  Patient identified by MRN, date of birth, ID band Patient awake    Reviewed: Allergy & Precautions, H&P , NPO status , Patient's Chart, lab work & pertinent test results  Airway Mallampati: III TM Distance: >3 FB Neck ROM: Full    Dental no notable dental hx.    Pulmonary sleep apnea and Continuous Positive Airway Pressure Ventilation ,  breath sounds clear to auscultation  Pulmonary exam normal       Cardiovascular negative cardio ROS  Rhythm:Regular Rate:Normal     Neuro/Psych PSYCHIATRIC DISORDERS Anxiety Depression negative neurological ROS  negative psych ROS   GI/Hepatic Neg liver ROS, GERD-  Medicated,  Endo/Other  diabetes, Type 2, Oral Hypoglycemic AgentsMorbid obesity  Renal/GU negative Renal ROS     Musculoskeletal negative musculoskeletal ROS (+)   Abdominal (+) + obese,   Peds  Hematology negative hematology ROS (+)   Anesthesia Other Findings   Reproductive/Obstetrics negative OB ROS                           Anesthesia Physical Anesthesia Plan  ASA: III  Anesthesia Plan: General   Post-op Pain Management:    Induction: Intravenous  Airway Management Planned: Oral ETT  Additional Equipment:   Intra-op Plan:   Post-operative Plan: Extubation in OR  Informed Consent: I have reviewed the patients History and Physical, chart, labs and discussed the procedure including the risks, benefits and alternatives for the proposed anesthesia with the patient or authorized representative who has indicated his/her understanding and acceptance.   Dental advisory given  Plan Discussed with: CRNA  Anesthesia Plan Comments:         Anesthesia Quick Evaluation

## 2014-03-18 NOTE — H&P (Signed)
Paige CitizenChristy J. Anderson 02/21/2014 3:32 PM Location: [facility]  Patient #: [id number] DOB: [birth year] Divorced / Language: [place] / Race: White Female  History of Present Illness [name](Jasmyn Picha H. Ezzard StandingNewman MD; 02/22/2014 11:51 AM) Patient words: pre-op.  The patient is a 38 year old female who presents for a bariatric surgery evaluation. Her PCP is Dr. Nelda SevereK. Rice. I did her mother's RYGB Randye LoboChristine Justice [phone]). She is with her today. She is doing well. She has gained some weight since I initially saw her and we talked about why that is bad. She needs to try to loose some weight before surgery. I went back over the surgery, the hospital course, and dishcharge plans.  UGI - 12/27/2013 - reflux without HH US - 12/27/2013 - Diffuse hepatic steatosis. Simple cysts in the kidneys. She saw Dr. Cyndia SkeetersLurey 12/27/2013.  Per the 1991 NIH Consensus Statement, the patient is a candidate for bariatric surgery. The patient attended our initial information session and reviewed the types of bariatric surgery. The patient is interested in the Roux en Y Gastric Bypass. I discussed with the patient the indications and risks of bariatric surgery. The potential risks of surgery include, but are not limited to, bleeding, infection, leak from the bowel, DVT and PE, open surgery, long term nutrition consequences, and death. The patient understands the importance of compliance and long term follow-up with our group after surgery.  Other medical conditions: 2. GERD 3. DM x 1 year  HgbA1C - 6.6 on 08/08/2013 4. Sleep apnea.  Has severe sleep apnea - saw Dr. Craige CottaSood on 02/01/2014 - for CPAP DN 02/04/2014 5. Colonic diverticula on CT scan of 07/01/2102  SOCIAL and FAMILY HISTORY: Divorced She comes with her mother, [name] [phone](#[phone], a RYGB patient of mine) Works with insurance, [employer] Has 2 children, 40 yo daughter and 40 yo son   Other Problems Sinclair Grooms([place], [place]; 02/22/2014 11:01  AM) Back Pain Diabetes Mellitus Gastroesophageal Reflux Disease Sleep Apnea  Past Surgical History Sinclair Grooms([place], RMA; 02/22/2014 11:01 AM) Cesarean Section - 1  Diagnostic Studies History ([place], RMA; 02/22/2014 11:01 AM) Colonoscopy never Mammogram 1-3 years ago Pap Smear 1-5 years ago  Allergies ([place], RMA; 02/22/2014 11:13 AM) Aspirin *ANALGESICS - NonNarcotic*  Medication History (Dahionnarah [place], RMA; 02/22/2014 11:14 AM) MetFORMIN HCl (500MG  Tablet, Oral) Active. PriLOSEC (40MG  Capsule DR, Oral) Active. Medications Reconciled  Social History Sinclair Grooms([place]ah [place], [place]; 02/22/2014 11:01 AM) Alcohol use Occasional alcohol use. Caffeine use Carbonated beverages. No drug use Tobacco use Never smoker.  Family History Sinclair Grooms([place]narah [place], [place]; 02/22/2014 11:01 AM) Breast Cancer Mother.  Pregnancy / Birth History Sinclair Grooms([place], [place]; 02/22/2014 11:01 AM) Age at menarche 10 years. Gravida 2 Irregular periods Maternal age 40-20 Para 2  Review of Systems (Dahionnarah Maldonado RMA; 02/22/2014 11:02 AM) General Not Present- Appetite Loss, Chills, Fatigue, Fever, Night Sweats, Weight Gain and Weight Loss. Skin Present- Dryness. Not Present- Change in Wart/Mole, Hives, Jaundice, New Lesions, Non-Healing Wounds, Rash and Ulcer. HEENT Present- Seasonal Allergies. Not Present- Earache, Hearing Loss, Hoarseness, Nose Bleed, Oral Ulcers, Ringing in the Ears, Sinus Pain, Sore Throat, Visual Disturbances, Wears glasses/contact lenses and Yellow Eyes. Respiratory Present- Snoring. Not Present- Bloody sputum, Chronic Cough, Difficulty Breathing and Wheezing. Breast Not Present- Breast Mass, Breast Pain, Nipple Discharge and Skin Changes. Cardiovascular Present- Leg Cramps and Swelling of Extremities. Not Present- Chest Pain, Difficulty Breathing Lying Down, Palpitations, Rapid Heart Rate and Shortness of  Breath. Gastrointestinal Not Present- Abdominal Pain, Bloating, Bloody Stool,  Change in Bowel Habits, Chronic diarrhea, Constipation, Difficulty Swallowing, Excessive gas, Gets full quickly at meals, Hemorrhoids, Indigestion, Nausea, Rectal Pain and Vomiting. Female Genitourinary Not Present- Frequency, Nocturia, Painful Urination, Pelvic Pain and Urgency. Musculoskeletal Present- Back Pain and Swelling of Extremities. Not Present- Joint Pain, Joint Stiffness, Muscle Pain and Muscle Weakness. Neurological Not Present- Decreased Memory, Fainting, Headaches, Numbness, Seizures, Tingling, Tremor, Trouble walking and Weakness. Psychiatric Not Present- Anxiety, Bipolar, Change in Sleep Pattern, Depression, Fearful and Frequent crying. Endocrine Not Present- Cold Intolerance, Excessive Hunger, Hair Changes, Heat Intolerance, Hot flashes and New Diabetes. Hematology Not Present- Easy Bruising, Excessive bleeding, Gland problems, HIV and Persistent Infections.   Vitals (Dahionnarah Mal[place]ado RMA; 02/22/2014 11:12 AM) 02/22/2014 11:06 AM Weight: 251.8 lb Height: 60in Body Surface Area: 2.2 m Body Mass Index: 49.18 kg/m Temp.: 98.2F(Oral)  Pulse: 114 (Regular)  P.OX: 97% (Room air) BP: 118/72 (Sitting, Right Arm, Standard)  Physical Exam  General: WN obese WF who is alert and generally healthy appearing. HEENT: Normal. Pupils equal.  Neck: Supple. No mass. No thyroid mass. Lymph Nodes: No supraclavicular or cervical nodes.  Lungs: Clear to auscultation and symmetric breath sounds. Heart: RRR. No murmur or rub.  Abdomen: Soft. No mass. No tenderness. No hernia. Normal bowel sounds. Pfannenstiel scar. She is more apple than pear. Extremities: Good strength and ROM in upper and lower extremities.  Neurologic: Grossly intact to motor and sensory function. Psychiatric: Has normal mood and affect. Behavior is normal.   Assessment & Plan: MORBID OBESITY WITH BMI OF 45.0-49.9, ADULT  (278.01  E66.01)  Impression: She is ready for surgery - 10/26.  She sees the nutritionist next week.  She has gained some weight - and I told her that is not good. She ought to be loosing weight.  I gave her a prescripton for golytely.  SLEEP APNEA IN ADULT (327.23  G47.33)  Impression: She is still getting studied to see if she needs O2.  Jerrie Gullo, MD, FACS [facility] Pager: [phone] Office phone:  [phone] 

## 2014-03-19 ENCOUNTER — Encounter (HOSPITAL_COMMUNITY): Payer: Self-pay | Admitting: *Deleted

## 2014-03-19 ENCOUNTER — Encounter (HOSPITAL_COMMUNITY): Payer: BC Managed Care – PPO | Admitting: Anesthesiology

## 2014-03-19 ENCOUNTER — Inpatient Hospital Stay (HOSPITAL_COMMUNITY): Payer: BC Managed Care – PPO | Admitting: Anesthesiology

## 2014-03-19 ENCOUNTER — Encounter (HOSPITAL_COMMUNITY)
Admission: RE | Disposition: A | Discharge status: Home / Self Care | Payer: Self-pay | Source: Ambulatory Visit | Attending: Surgery

## 2014-03-19 ENCOUNTER — Inpatient Hospital Stay (HOSPITAL_COMMUNITY)
Admission: RE | Admit: 2014-03-19 | Discharge: 2014-03-22 | DRG: 620 | Disposition: A | Discharge status: Home / Self Care | Payer: BC Managed Care – PPO | Source: Ambulatory Visit | Attending: Surgery | Admitting: Surgery

## 2014-03-19 DIAGNOSIS — G4730 Sleep apnea, unspecified: Secondary | ICD-10-CM | POA: Diagnosis present

## 2014-03-19 DIAGNOSIS — Z6841 Body Mass Index (BMI) 40.0 and over, adult: Secondary | ICD-10-CM

## 2014-03-19 DIAGNOSIS — D62 Acute posthemorrhagic anemia: Secondary | ICD-10-CM | POA: Diagnosis not present

## 2014-03-19 DIAGNOSIS — E119 Type 2 diabetes mellitus without complications: Secondary | ICD-10-CM | POA: Diagnosis present

## 2014-03-19 DIAGNOSIS — K760 Fatty (change of) liver, not elsewhere classified: Secondary | ICD-10-CM | POA: Diagnosis present

## 2014-03-19 DIAGNOSIS — Z9889 Other specified postprocedural states: Secondary | ICD-10-CM

## 2014-03-19 DIAGNOSIS — D500 Iron deficiency anemia secondary to blood loss (chronic): Secondary | ICD-10-CM | POA: Diagnosis present

## 2014-03-19 DIAGNOSIS — Z01812 Encounter for preprocedural laboratory examination: Secondary | ICD-10-CM

## 2014-03-19 DIAGNOSIS — K219 Gastro-esophageal reflux disease without esophagitis: Secondary | ICD-10-CM | POA: Diagnosis present

## 2014-03-19 HISTORY — PX: GASTRIC ROUX-EN-Y: SHX5262

## 2014-03-19 LAB — GLUCOSE, CAPILLARY
GLUCOSE-CAPILLARY: 164 mg/dL — AB (ref 70–99)
GLUCOSE-CAPILLARY: 118 mg/dL — AB (ref 70–99)
GLUCOSE-CAPILLARY: 117 mg/dL — AB (ref 70–99)
Glucose-Capillary: 103 mg/dL — ABNORMAL HIGH (ref 70–99)

## 2014-03-19 LAB — HEMOGLOBIN AND HEMATOCRIT, BLOOD
HCT: 26.9 % — ABNORMAL LOW (ref 36.0–46.0)
Hemoglobin: 8.1 g/dL — ABNORMAL LOW (ref 12.0–15.0)

## 2014-03-19 LAB — PREGNANCY, URINE: Preg Test, Ur: NEGATIVE

## 2014-03-19 SURGERY — LAPAROSCOPIC ROUX-EN-Y GASTRIC BYPASS WITH UPPER ENDOSCOPY
Anesthesia: General

## 2014-03-19 MED ORDER — ONDANSETRON HCL 4 MG/2ML IJ SOLN: 4.0000 mg | INTRAMUSCULAR | Status: DC | PRN

## 2014-03-19 MED ORDER — BUPIVACAINE HCL (PF) 0.25 % IJ SOLN
INTRAMUSCULAR | Status: AC
  Filled 2014-03-19: qty 30

## 2014-03-19 MED ORDER — FENTANYL CITRATE 0.05 MG/ML IJ SOLN
INTRAMUSCULAR | Status: AC
Start: 2014-03-19 — End: 2014-03-19
  Filled 2014-03-19: qty 2

## 2014-03-19 MED ORDER — ROCURONIUM BROMIDE 100 MG/10ML IV SOLN
INTRAVENOUS | Status: AC
  Filled 2014-03-19: qty 1

## 2014-03-19 MED ORDER — DEXTROSE 5 % IV SOLN
2.0000 g | INTRAVENOUS | Status: AC
  Administered 2014-03-19: 2 g via INTRAVENOUS

## 2014-03-19 MED ORDER — GLYCOPYRROLATE 0.2 MG/ML IJ SOLN
INTRAMUSCULAR | Status: AC
  Filled 2014-03-19: qty 3

## 2014-03-19 MED ORDER — STERILE WATER FOR IRRIGATION IR SOLN
Status: DC | PRN
  Administered 2014-03-19: 1500 mL

## 2014-03-19 MED ORDER — HEPARIN SODIUM (PORCINE) 5000 UNIT/ML IJ SOLN
5000.0000 [IU] | Freq: Three times a day (TID) | INTRAMUSCULAR | Status: DC
  Filled 2014-03-19 (×3): qty 1

## 2014-03-19 MED ORDER — HYDROMORPHONE HCL 1 MG/ML IJ SOLN
INTRAMUSCULAR | Status: AC
  Filled 2014-03-19: qty 1

## 2014-03-19 MED ORDER — LACTATED RINGERS IV SOLN: INTRAVENOUS | Status: DC

## 2014-03-19 MED ORDER — CHLORHEXIDINE GLUCONATE 4 % EX LIQD
60.0000 mL | Freq: Once | CUTANEOUS | Status: DC
Start: 2014-03-20 — End: 2014-03-19

## 2014-03-19 MED ORDER — NEOSTIGMINE METHYLSULFATE 10 MG/10ML IV SOLN
INTRAVENOUS | Status: DC | PRN
Start: 2014-03-19 — End: 2014-03-19
  Administered 2014-03-19: 3.5 mg via INTRAVENOUS

## 2014-03-19 MED ORDER — MIDAZOLAM HCL 5 MG/5ML IJ SOLN
INTRAMUSCULAR | Status: DC | PRN
  Administered 2014-03-19: 2 mg via INTRAVENOUS

## 2014-03-19 MED ORDER — EPHEDRINE SULFATE 50 MG/ML IJ SOLN
INTRAMUSCULAR | Status: DC | PRN
  Administered 2014-03-19: 10 mg via INTRAVENOUS

## 2014-03-19 MED ORDER — 0.9 % SODIUM CHLORIDE (POUR BTL) OPTIME
TOPICAL | Status: DC | PRN
  Administered 2014-03-19: 1000 mL

## 2014-03-19 MED ORDER — MORPHINE SULFATE 2 MG/ML IJ SOLN
2.0000 mg | INTRAMUSCULAR | Status: DC | PRN
  Administered 2014-03-19: 2 mg via INTRAVENOUS
  Administered 2014-03-19: 4 mg via INTRAVENOUS
  Administered 2014-03-19: 3 mg via INTRAVENOUS
  Administered 2014-03-19: 2 mg via INTRAVENOUS
  Administered 2014-03-20 (×5): 4 mg via INTRAVENOUS
  Filled 2014-03-19 (×4): qty 2
  Filled 2014-03-19 (×2): qty 1
  Filled 2014-03-19 (×2): qty 2

## 2014-03-19 MED ORDER — ROCURONIUM BROMIDE 100 MG/10ML IV SOLN
INTRAVENOUS | Status: DC | PRN
  Administered 2014-03-19 (×2): 10 mg via INTRAVENOUS
  Administered 2014-03-19: 40 mg via INTRAVENOUS
  Administered 2014-03-19 (×2): 5 mg via INTRAVENOUS
  Administered 2014-03-19: 10 mg via INTRAVENOUS

## 2014-03-19 MED ORDER — HEPARIN SODIUM (PORCINE) 5000 UNIT/ML IJ SOLN
5000.0000 [IU] | Freq: Three times a day (TID) | INTRAMUSCULAR | Status: DC
  Administered 2014-03-19 – 2014-03-22 (×8): 5000 [IU] via SUBCUTANEOUS
  Filled 2014-03-19 (×11): qty 1

## 2014-03-19 MED ORDER — FENTANYL CITRATE 0.05 MG/ML IJ SOLN
INTRAMUSCULAR | Status: AC
  Filled 2014-03-19: qty 5

## 2014-03-19 MED ORDER — UNJURY VANILLA POWDER
2.0000 [oz_av] | Freq: Four times a day (QID) | ORAL | Status: DC
  Administered 2014-03-21: 2 [oz_av] via ORAL

## 2014-03-19 MED ORDER — SUCCINYLCHOLINE CHLORIDE 20 MG/ML IJ SOLN
INTRAMUSCULAR | Status: DC | PRN
  Administered 2014-03-19: 100 mg via INTRAVENOUS

## 2014-03-19 MED ORDER — CHLORHEXIDINE GLUCONATE 4 % EX LIQD: 60.0000 mL | Freq: Once | CUTANEOUS | Status: DC

## 2014-03-19 MED ORDER — MEPERIDINE HCL 50 MG/ML IJ SOLN: 6.2500 mg | INTRAMUSCULAR | Status: DC | PRN

## 2014-03-19 MED ORDER — DEXTROSE 5 % IV SOLN
2.0000 g | Freq: Once | INTRAVENOUS | Status: AC
  Administered 2014-03-19: 2 g via INTRAVENOUS
  Filled 2014-03-19: qty 2

## 2014-03-19 MED ORDER — PROPOFOL 10 MG/ML IV BOLUS
INTRAVENOUS | Status: AC
  Filled 2014-03-19: qty 20

## 2014-03-19 MED ORDER — INSULIN ASPART 100 UNIT/ML ~~LOC~~ SOLN
0.0000 [IU] | SUBCUTANEOUS | Status: DC
  Administered 2014-03-20: 1 [IU] via SUBCUTANEOUS

## 2014-03-19 MED ORDER — HYDROMORPHONE HCL 1 MG/ML IJ SOLN
0.2500 mg | INTRAMUSCULAR | Status: DC | PRN
  Administered 2014-03-19: 0.5 mg via INTRAVENOUS
  Administered 2014-03-19 (×2): 0.25 mg via INTRAVENOUS

## 2014-03-19 MED ORDER — HEPARIN SODIUM (PORCINE) 5000 UNIT/ML IJ SOLN
5000.0000 [IU] | Freq: Once | INTRAMUSCULAR | Status: AC
  Administered 2014-03-19: 5000 [IU] via SUBCUTANEOUS
  Filled 2014-03-19: qty 1

## 2014-03-19 MED ORDER — PROPOFOL 10 MG/ML IV BOLUS
INTRAVENOUS | Status: DC | PRN
  Administered 2014-03-19: 150 mg via INTRAVENOUS

## 2014-03-19 MED ORDER — UNJURY CHOCOLATE CLASSIC POWDER
2.0000 [oz_av] | Freq: Four times a day (QID) | ORAL | Status: DC
  Administered 2014-03-21 (×2): 2 [oz_av] via ORAL

## 2014-03-19 MED ORDER — TISSEEL VH 10 ML EX KIT
PACK | CUTANEOUS | Status: AC
  Filled 2014-03-19: qty 2

## 2014-03-19 MED ORDER — BUPIVACAINE-EPINEPHRINE (PF) 0.25% -1:200000 IJ SOLN
INTRAMUSCULAR | Status: AC
  Filled 2014-03-19: qty 30

## 2014-03-19 MED ORDER — ONDANSETRON HCL 4 MG/2ML IJ SOLN
INTRAMUSCULAR | Status: AC
  Filled 2014-03-19: qty 2

## 2014-03-19 MED ORDER — HYDROMORPHONE HCL 2 MG/ML IJ SOLN
INTRAMUSCULAR | Status: AC
  Filled 2014-03-19: qty 1

## 2014-03-19 MED ORDER — GLYCOPYRROLATE 0.2 MG/ML IJ SOLN
INTRAMUSCULAR | Status: DC | PRN
  Administered 2014-03-19: 0.6 mg via INTRAVENOUS

## 2014-03-19 MED ORDER — DEXTROSE 5 % IV SOLN
INTRAVENOUS | Status: AC
  Filled 2014-03-19: qty 2

## 2014-03-19 MED ORDER — PROMETHAZINE HCL 25 MG/ML IJ SOLN: 6.2500 mg | INTRAMUSCULAR | Status: DC | PRN

## 2014-03-19 MED ORDER — HEPARIN SODIUM (PORCINE) 5000 UNIT/ML IJ SOLN
5000.0000 [IU] | INTRAMUSCULAR | Status: AC
  Administered 2014-03-19: 5000 [IU] via SUBCUTANEOUS
  Filled 2014-03-19: qty 1

## 2014-03-19 MED ORDER — ACETAMINOPHEN 160 MG/5ML PO SOLN: 650.0000 mg | ORAL | Status: DC | PRN

## 2014-03-19 MED ORDER — LACTATED RINGERS IR SOLN
Status: DC | PRN
  Administered 2014-03-19: 3000 mL

## 2014-03-19 MED ORDER — POTASSIUM CHLORIDE IN NACL 20-0.45 MEQ/L-% IV SOLN
INTRAVENOUS | Status: DC
  Administered 2014-03-19 – 2014-03-20 (×3): via INTRAVENOUS
  Administered 2014-03-20: 200 mL/h via INTRAVENOUS
  Administered 2014-03-20: 150 mL/h via INTRAVENOUS
  Administered 2014-03-21: 20:00:00 via INTRAVENOUS
  Administered 2014-03-21: 150 mL/h via INTRAVENOUS
  Administered 2014-03-22: 06:00:00 via INTRAVENOUS
  Filled 2014-03-19 (×13): qty 1000

## 2014-03-19 MED ORDER — EPHEDRINE SULFATE 50 MG/ML IJ SOLN
INTRAMUSCULAR | Status: AC
  Filled 2014-03-19: qty 1

## 2014-03-19 MED ORDER — ACETAMINOPHEN 160 MG/5ML PO SOLN: 325.0000 mg | ORAL | Status: DC | PRN

## 2014-03-19 MED ORDER — FENTANYL CITRATE 0.05 MG/ML IJ SOLN
INTRAMUSCULAR | Status: DC | PRN
  Administered 2014-03-19 (×2): 50 ug via INTRAVENOUS
  Administered 2014-03-19: 100 ug via INTRAVENOUS
  Administered 2014-03-19 (×3): 50 ug via INTRAVENOUS

## 2014-03-19 MED ORDER — LACTATED RINGERS IV SOLN
INTRAVENOUS | Status: DC | PRN
  Administered 2014-03-19 (×2): via INTRAVENOUS

## 2014-03-19 MED ORDER — LIDOCAINE HCL (PF) 2 % IJ SOLN
INTRAMUSCULAR | Status: DC | PRN
  Administered 2014-03-19: 20 mg via INTRADERMAL

## 2014-03-19 MED ORDER — ONDANSETRON HCL 4 MG/2ML IJ SOLN
INTRAMUSCULAR | Status: DC | PRN
Start: 2014-03-19 — End: 2014-03-19
  Administered 2014-03-19: 4 mg via INTRAVENOUS

## 2014-03-19 MED ORDER — SODIUM CHLORIDE 0.9 % IJ SOLN
INTRAMUSCULAR | Status: AC
  Filled 2014-03-19: qty 10

## 2014-03-19 MED ORDER — BUPIVACAINE HCL (PF) 0.25 % IJ SOLN
INTRAMUSCULAR | Status: DC | PRN
  Administered 2014-03-19: 30 mL

## 2014-03-19 MED ORDER — MIDAZOLAM HCL 2 MG/2ML IJ SOLN
INTRAMUSCULAR | Status: AC
  Filled 2014-03-19: qty 2

## 2014-03-19 MED ORDER — OXYCODONE HCL 5 MG/5ML PO SOLN
5.0000 mg | ORAL | Status: DC | PRN
  Administered 2014-03-20 – 2014-03-22 (×5): 10 mg via ORAL
  Filled 2014-03-19: qty 10
  Filled 2014-03-19: qty 50
  Filled 2014-03-19 (×3): qty 10

## 2014-03-19 MED ORDER — UNJURY CHICKEN SOUP POWDER
2.0000 [oz_av] | Freq: Four times a day (QID) | ORAL | Status: DC
  Administered 2014-03-21: 2 [oz_av] via ORAL

## 2014-03-19 MED ORDER — TISSEEL VH 10 ML EX KIT
PACK | CUTANEOUS | Status: DC | PRN
  Administered 2014-03-19: 20 mL

## 2014-03-19 SURGICAL SUPPLY — 62 items
BLADE SURG 15 STRL LF DISP TIS (BLADE) ×1 IMPLANT
BLADE SURG 15 STRL SS (BLADE) ×2
CABLE HIGH FREQUENCY MONO STRZ (ELECTRODE) IMPLANT
CHLORAPREP W/TINT 26ML (MISCELLANEOUS) ×9 IMPLANT
CLIP SUT LAPRA TY ABSORB (SUTURE) ×6 IMPLANT
DECANTER SPIKE VIAL GLASS SM (MISCELLANEOUS) IMPLANT
DERMABOND ADVANCED (GAUZE/BANDAGES/DRESSINGS) ×4
DERMABOND ADVANCED .7 DNX12 (GAUZE/BANDAGES/DRESSINGS) ×2 IMPLANT
DEVICE SUTURE ENDOST 10MM (ENDOMECHANICALS) ×3 IMPLANT
DISSECTOR BLUNT TIP ENDO 5MM (MISCELLANEOUS) IMPLANT
DRAIN PENROSE 18X1/4 LTX STRL (WOUND CARE) ×3 IMPLANT
DRAPE CAMERA CLOSED 9X96 (DRAPES) ×3 IMPLANT
DUPLOJECT EASY PREP 4ML (MISCELLANEOUS) ×3 IMPLANT
GAUZE SPONGE 4X4 16PLY XRAY LF (GAUZE/BANDAGES/DRESSINGS) IMPLANT
GLOVE SURG SIGNA 7.5 PF LTX (GLOVE) ×3 IMPLANT
GOWN SPEC L4 XLG W/TWL (GOWN DISPOSABLE) ×3 IMPLANT
GOWN STRL REUS W/TWL XL LVL3 (GOWN DISPOSABLE) ×9 IMPLANT
HOVERMATT SINGLE USE (MISCELLANEOUS) ×3 IMPLANT
KIT BASIN OR (CUSTOM PROCEDURE TRAY) ×3 IMPLANT
KIT GASTRIC LAVAGE 34FR ADT (SET/KITS/TRAYS/PACK) ×3 IMPLANT
NEEDLE SPNL 22GX3.5 QUINCKE BK (NEEDLE) ×3 IMPLANT
PACK CARDIOVASCULAR III (CUSTOM PROCEDURE TRAY) ×3 IMPLANT
PEN SKIN MARKING BROAD (MISCELLANEOUS) ×3 IMPLANT
POUCH SPECIMEN RETRIEVAL 10MM (ENDOMECHANICALS) IMPLANT
RELOAD 45 VASCULAR/THIN (ENDOMECHANICALS) ×3 IMPLANT
RELOAD ENDO STITCH 2.0 (ENDOMECHANICALS) ×22
RELOAD STAPLE TA45 3.5 REG BLU (ENDOMECHANICALS) ×3 IMPLANT
RELOAD STAPLER BLUE 60MM (STAPLE) ×4 IMPLANT
RELOAD STAPLER GOLD 60MM (STAPLE) IMPLANT
RELOAD STAPLER WHITE 60MM (STAPLE) IMPLANT
SCISSORS LAP 5X35 DISP (ENDOMECHANICALS) ×3 IMPLANT
SEALANT SURGICAL APPL DUAL CAN (MISCELLANEOUS) ×3 IMPLANT
SET IRRIG TUBING LAPAROSCOPIC (IRRIGATION / IRRIGATOR) ×3 IMPLANT
SHEARS HARMONIC ACE PLUS 36CM (ENDOMECHANICALS) ×3 IMPLANT
SLEEVE ADV FIXATION 12X100MM (TROCAR) IMPLANT
SLEEVE ENDOPATH XCEL 5M (ENDOMECHANICALS) ×3 IMPLANT
SOLUTION ANTI FOG 6CC (MISCELLANEOUS) ×3 IMPLANT
STAPLE ECHEON FLEX 60 POW ENDO (STAPLE) ×3 IMPLANT
STAPLER RELOAD BLUE 60MM (STAPLE) ×12
STAPLER RELOAD GOLD 60MM (STAPLE)
STAPLER RELOAD WHITE 60MM (STAPLE)
STAPLER VISISTAT 35W (STAPLE) IMPLANT
SUT MON AB 5-0 PS2 18 (SUTURE) ×6 IMPLANT
SUT RELOAD ENDO STITCH 2 48X1 (ENDOMECHANICALS) ×7
SUT RELOAD ENDO STITCH 2.0 (ENDOMECHANICALS) ×4
SUT VIC AB 2-0 SH 27 (SUTURE) ×2
SUT VIC AB 2-0 SH 27X BRD (SUTURE) ×1 IMPLANT
SUTURE RELOAD END STTCH 2 48X1 (ENDOMECHANICALS) ×7 IMPLANT
SUTURE RELOAD ENDO STITCH 2.0 (ENDOMECHANICALS) ×4 IMPLANT
SYR 20CC LL (SYRINGE) ×6 IMPLANT
SYR 50ML LL SCALE MARK (SYRINGE) ×3 IMPLANT
TOWEL OR 17X26 10 PK STRL BLUE (TOWEL DISPOSABLE) ×3 IMPLANT
TOWEL OR NON WOVEN STRL DISP B (DISPOSABLE) ×3 IMPLANT
TRAY FOLEY CATH 14FRSI W/METER (CATHETERS) ×3 IMPLANT
TROCAR ADV FIXATION 12X100MM (TROCAR) ×3 IMPLANT
TROCAR ADV FIXATION 5X100MM (TROCAR) ×3 IMPLANT
TROCAR BLADELESS OPT 5 100 (ENDOMECHANICALS) ×3 IMPLANT
TROCAR UNIVERSAL OPT 12M 100M (ENDOMECHANICALS) IMPLANT
TROCAR XCEL 12X100 BLDLESS (ENDOMECHANICALS) ×3 IMPLANT
TROCAR XCEL NON-BLD 11X100MML (ENDOMECHANICALS) ×3 IMPLANT
TUBING ENDO SMARTCAP (MISCELLANEOUS) ×3 IMPLANT
TUBING FILTER THERMOFLATOR (ELECTROSURGICAL) ×3 IMPLANT

## 2014-03-19 NOTE — Op Note (Signed)
PATIENT:   Paige Anderson DOB:   1975-08-19 MRN:   [id number]  DATE OF PROCEDURE: 03/19/2014                   [facility]  OPERATIVE REPORT  PREOPERATIVE DIAGNOSIS:  Morbid obesity.  POSTOPERATIVE DIAGNOSIS:  Morbid obesity (weight 251, BMI of 49.2).  PROCEDURE:  Laparoscopic Roux-en-Y gastric bypass, antecolic, antegastric (intraoperative upper endoscopy by Sheron NightingaleM. Martin)  SURGEON:  Sandria Balesavid H. Ezzard StandingNewman, MD  FIRST ASSISTANT:  Sheron NightingaleM. Martin, MD  ANESTHESIA:  General endotracheal.  Anesthesiologist: Gaylan GeroldJohn R Germeroth, MD CRNA: Vista LawmanBeth E Eargle, CRNA; Doran ClayStephen R Alday, CRNA  General  ESTIMATED BLOOD LOSS:  Minimal.  LOCAL ANESTHESIA:  30 cc of 1/4% Marcaine  COMPLICATIONS:  None.  INDICATION FOR SURGERY:  Paige Anderson is a 38 y.o. white  female who sees Bosie ClosICE,KATHLEEN M, MD as her primary care doctor.  She has completed our preoperative bariatric program and now comes for a laparoscopic Roux-en-Y gastric bypass.  The indications, potential complications of surgery were explained to the patient.  Potential complications of the surgery include, but are not limited to, bleeding, infection, DVT, open surgery, and long-term nutritional consequences.  OPERATIVE NOTE:  The patient [facility] where she underwent a general endotracheal anesthetic, supervised by Anesthesiologist: Gaylan GeroldJohn R Germeroth, MD CRNA: Vista LawmanBeth E Eargle, CRNA; Doran ClayStephen R Alday, CRNA.  The patient was given 2 g of cefoxitin at the beginning of the procedure.  A time-out was held and surgical checklist run.  The abdomen was prepped with ChloraPrep and sterilely draped.  I accessed the abdominal cavity through the left upper quadrant using a 12 mm Optiview trocar.  I placed 6 additional trocars: 5 mm subxiphoid, 12 mm right subcostal, 12 mm right paramedian, 12 mm left paramedian, 5 mm lateral subcostal, and a 11 mm below to the right of the umbilicus.  The abdomen was insufflated and abdominal exploration carried  out.  Right and left lobes of liver unremarkable.  She did have a fatty liver that presented some problems in retracting it out of the field. The stomach that I could see was unremarkable.  The patient had a moderate amount of greater omentum which draped over the bowel.  I was able to push the omentum and transverse colon up and identified the ligament of Treitz to start the operation.  I measured 40 cm of the jejunum, starting at the ligament of Tritz, and divided the jejunum with a white load of 45 mm Ethicon Endo-GIA stapler.  I divided a short length into the mesentery.  I measured 100 cm of jejunum for the future gastric limb.  I put a Penrose drain on the future gastric limb of the jejunum.  I then did a side-to-side jejunojejunostomy.  I used a 45 mm white load of the Ethicon Endo-GIA stapler.  I closed the enterotomy with 2 running 2-0 Vicryl sutures.  I tested the JJ anastomosis with an alligator forceps and then covered this with Tisseel.  I closed the mesenteric defect with a running 2-0 silk suture with a Laparo-tye on each end.  I then divided the omentum with a Harmonic Scalpel.  I positioned the patient in reverse Trendelenburg and placed the liver retractor, which was introduced into the peritoneal cavity through a subxiphoid 5 mm trocar puncture, under the left lobe of the liver.  I then identified the gastroesophageal junction.  I went to the left at the angle of His and  made a window at the left side of the esophago-gastric junction for a target as my dissection.  I then went on the lesser curve of the stomach, measured 5 cm from the gastroesophageal junction down the lesser curve and dissected into the lesser sac from the lesser curvature side of the stomach.  I did the first firing of a 45 mm blue load Ethicon Endo-GIA stapler and then did 4 firings of the 60 mm blue load Ethicon Eschelon stapler.  This created a gastric pouch approximately 5 cm in length and 3 cm in width.   There was no bleeding from either the pouch or the stomach remnant site.  I placed Tisseel on the pouch side along the new greater curvature.  I over sewed the gastric remnant with a locking 2-0 Vicryl suture with a Laparo-tye on each end..  I then brought the jejunum ante-colic, ante-gastric up to the new stomach pouch and placed a posterior running 2-0 Vicryl suture.  I then made an enterotomy into the stomach using the Ewald as a back stop and an enterotomy into the jejunum.  I did a stapled side-to-side gastrojejunal anastomosis using these two enterotomies with a 45 mm blue load of the Ethicon Endo GIA stapler.  I tried to create a 2.5 cm gastrojejunal anastomosis.  I closed the enterotomy with a 2 running 2-0 Vicryl sutures.  I passed the Ewald tube through the gastrojejunal anastomosis and then did an anterior Connell suture running of 2-0 Vicryl suture for the anterior layer of the gastrojejunostomy.  The Ewald tube was then removed without difficulty.  I then closed the West LinePeterson defect with a figure-of-eight 2-0 silk suture between the mesentery of the transverse colon and the mesentery of the distal jejunum.  Dr. Sheron NightingaleM. Martin then scrubbed out and did an intraoperative upper endoscopy.  He identified the esophagogastric junction about 40 cm, the gastrojejunal anastomosis about 45 cm.  I clamped off the small bowel.  He insufflated air and I flooded the abdomen with saline. There was no bubbling or evidence of air leak.  He then withdrew the scope and he will dictate that portion of the operation.    I then re-inspected the anastomoses, sucked out the saline, placed Tisseel over the stomach pouch and gastrojejunal anastomosis.   The liver retractor was removed.  The trocars were removed.  There was no bleeding at any trocar site.  The skin at each trocar site was closed with a 5-0 Monocryl suture.  I infiltrated a total about 30 cc of 0.25% Marcaine at the trocar sites.    After the skin incisions  were closed with sutures they were painted with Dermabond.  The sponge and needle count were correct at the end of the case.  The patient tolerated the procedure well, was transported to the recovery room in good condition.   Ovidio Kinavid Maneh Sieben, MD, [facility] Pager: [phone] Office phone:  [phone]

## 2014-03-19 NOTE — Progress Notes (Signed)
Pt set-up on her own CPAP machine. Sterile water placed in machine with a 2 lpm bleed-in. [employer] called to check machine.

## 2014-03-19 NOTE — Interval H&P Note (Signed)
History and Physical Interval Note:  03/19/2014 7:11 AM  Paige Anderson  has presented today for surgery, with the diagnosis of Morbid Obesity  The various methods of treatment have been discussed with the patient and family.  Her mother and daughter are here with her.  She is on CPAP now.  She has not lost any further weight.  After consideration of risks, benefits and other options for treatment, the patient has consented to  Procedure(s): LAPAROSCOPIC ROUX-EN-Y GASTRIC BYPASS WITH UPPER ENDOSCOPY (N/A) as a surgical intervention .  The patient's history has been reviewed, patient examined, no change in status, stable for surgery.  I have reviewed the patient's chart and labs.  Questions were answered to the patient's satisfaction.     Nai Borromeo H

## 2014-03-19 NOTE — Anesthesia Postprocedure Evaluation (Signed)
Anesthesia Post Note  Patient: Paige Anderson  Procedure(s) Performed: Procedure(s) (LRB): LAPAROSCOPIC ROUX-EN-Y GASTRIC BYPASS WITH UPPER ENDOSCOPY (N/A)  Anesthesia type: General  Patient location: PACU  Post pain: Pain level controlled  Post assessment: Post-op Vital signs reviewed  Last Vitals: BP 177/94  Pulse 104  Temp(Src) 37.1 C (Oral)  Resp 12  Ht 5' (1.524 m)  Wt 250 lb (113.399 kg)  BMI 48.82 kg/m2  SpO2 97%  LMP 01/06/2014  Post vital signs: Reviewed  Level of consciousness: sedated  Complications: No apparent anesthesia complications

## 2014-03-19 NOTE — Transfer of Care (Signed)
Immediate Anesthesia Transfer of Care Note  Patient: Paige Anderson  Procedure(s) Performed: Procedure(s) (LRB): LAPAROSCOPIC ROUX-EN-Y GASTRIC BYPASS WITH UPPER ENDOSCOPY (N/A)  Patient Location: PACU  Anesthesia Type: General  Level of Consciousness: sedated, patient cooperative and responds to stimulation  Airway & Oxygen Therapy: Patient Spontanous Breathing and Patient connected to face mask oxgen  Post-op Assessment: Report given to PACU RN and Post -op Vital signs reviewed and stable  Post vital signs: Reviewed and stable  Complications: No apparent anesthesia complications

## 2014-03-19 NOTE — H&P (View-Only) (Signed)
Paige Anderson 02/21/2014 3:32 PM Location: [facility]  Patient #: [id number] DOB: [birth year] Divorced / Language: [place] / Race: White Female  History of Present Illness Paige Anderson(Paige Namba H. Ezzard StandingNewman MD; 02/22/2014 11:51 AM) Patient words: pre-op.  The patient is a 38 year old female who presents for a bariatric surgery evaluation. Her PCP is Dr. Nelda SevereK. Anderson. I did her mother's RYGB Paige Anderson [phone](#[phone]). She is with her today. She is doing well. She has gained some weight since I initially saw her and we talked about why that is bad. She needs to try to loose some weight before surgery. I went back over the surgery, the hospital course, and dishcharge plans.  UGI - 12/27/2013 - reflux without HH US - 12/27/2013 - Diffuse hepatic steatosis. Simple cysts in the kidneys. She saw Dr. Cyndia Anderson 12/27/2013.  Per the 1991 NIH Consensus Statement, the patient is a candidate for bariatric surgery. The patient attended our initial information session and reviewed the types of bariatric surgery. The patient is interested in the Roux en Y Gastric Bypass. I discussed with the patient the indications and risks of bariatric surgery. The potential risks of surgery include, but are not limited to, bleeding, infection, leak from the bowel, DVT and PE, open surgery, long term nutrition consequences, and death. The patient understands the importance of compliance and long term follow-up with our group after surgery.  Other medical conditions: 2. GERD 3. DM x 1 year  HgbA1C - 6.6 on 08/08/2013 4. Sleep apnea.  Has severe sleep apnea - saw Dr. Craige Anderson on 02/01/2014 - for CPAP DN 02/04/2014 5. Colonic diverticula on CT scan of 07/01/2102  SOCIAL and FAMILY HISTORY: Divorced She comes with her mother, Paige Anderson [phone](#[id number], a RYGB patient of mine) Works with insurance, [employer] Has 2 children, 40 yo daughter and 40 yo son   Other Problems Paige Anderson, [place]; 02/22/2014 11:01  AM) Back Pain Diabetes Mellitus Gastroesophageal Reflux Disease Sleep Apnea  Past Surgical History Paige Anderson(Paige Anderson, RMA; 02/22/2014 11:01 AM) Cesarean Section - 1  Diagnostic Studies History (Paige Anderson, RMA; 02/22/2014 11:01 AM) Colonoscopy never Mammogram 1-3 years ago Pap Smear 1-5 years ago  Allergies (Paige Anderson, RMA; 02/22/2014 11:13 AM) Aspirin *ANALGESICS - NonNarcotic*  Medication History (Paige Maldonado, RMA; 02/22/2014 11:14 AM) MetFORMIN HCl (500MG  Tablet, Oral) Active. PriLOSEC (40MG  Capsule DR, Oral) Active. Medications Reconciled  Social History Paige Anderson(Paige Anderson, [place]; 02/22/2014 11:01 AM) Alcohol use Occasional alcohol use. Caffeine use Carbonated beverages. No drug use Tobacco use Never smoker.  Family History Paige Anderson(Paige Anderson, [place]; 02/22/2014 11:01 AM) Breast Cancer Mother.  Pregnancy / Birth History Paige Anderson(Paige Anderson, [place]; 02/22/2014 11:01 AM) Age at menarche 10 years. Gravida 2 Irregular periods Maternal age 40-20 Para 2  Review of Systems (Paige Maldonado RMA; 02/22/2014 11:02 AM) General Not Present- Appetite Loss, Chills, Fatigue, Fever, Night Sweats, Weight Gain and Weight Loss. Skin Present- Dryness. Not Present- Change in Wart/Mole, Hives, Jaundice, New Lesions, Non-Healing Wounds, Rash and Ulcer. HEENT Present- Seasonal Allergies. Not Present- Earache, Hearing Loss, Hoarseness, Nose Bleed, Oral Ulcers, Ringing in the Ears, Sinus Pain, Sore Throat, Visual Disturbances, Wears glasses/contact lenses and Yellow Eyes. Respiratory Present- Snoring. Not Present- Bloody sputum, Chronic Cough, Difficulty Breathing and Wheezing. Breast Not Present- Breast Mass, Breast Pain, Nipple Discharge and Skin Changes. Cardiovascular Present- Leg Cramps and Swelling of Extremities. Not Present- Chest Pain, Difficulty Breathing Lying Down, Palpitations, Rapid Heart Rate and Shortness of  Breath. Gastrointestinal Not Present- Abdominal Pain, Bloating, Bloody Stool,  Change in Bowel Habits, Chronic diarrhea, Constipation, Difficulty Swallowing, Excessive gas, Gets full quickly at meals, Hemorrhoids, Indigestion, Nausea, Rectal Pain and Vomiting. Female Genitourinary Not Present- Frequency, Nocturia, Painful Urination, Pelvic Pain and Urgency. Musculoskeletal Present- Back Pain and Swelling of Extremities. Not Present- Joint Pain, Joint Stiffness, Muscle Pain and Muscle Weakness. Neurological Not Present- Decreased Memory, Fainting, Headaches, Numbness, Seizures, Tingling, Tremor, Trouble walking and Weakness. Psychiatric Not Present- Anxiety, Bipolar, Change in Sleep Pattern, Depression, Fearful and Frequent crying. Endocrine Not Present- Cold Intolerance, Excessive Hunger, Hair Changes, Heat Intolerance, Hot flashes and New Diabetes. Hematology Not Present- Easy Bruising, Excessive bleeding, Gland problems, HIV and Persistent Infections.   Vitals (Paige Maldonado RMA; 02/22/2014 11:12 AM) 02/22/2014 11:06 AM Weight: 251.8 lb Height: 60in Body Surface Area: 2.2 m Body Mass Index: 49.18 kg/m Temp.: 98.63F(Oral)  Pulse: 114 (Regular)  P.OX: 97% (Room air) BP: 118/72 (Sitting, Right Arm, Standard)  Physical Exam  General: WN obese WF who is alert and generally healthy appearing. HEENT: Normal. Pupils equal.  Neck: Supple. No mass. No thyroid mass. Lymph Nodes: No supraclavicular or cervical nodes.  Lungs: Clear to auscultation and symmetric breath sounds. Heart: RRR. No murmur or rub.  Abdomen: Soft. No mass. No tenderness. No hernia. Normal bowel sounds. Pfannenstiel scar. She is more apple than pear. Extremities: Good strength and ROM in upper and lower extremities.  Neurologic: Grossly intact to motor and sensory function. Psychiatric: Has normal mood and affect. Behavior is normal.   Assessment & Plan: MORBID OBESITY WITH BMI OF 45.0-49.9, ADULT  (278.01  E66.01)  Impression: She is ready for surgery - 10/26.  She sees the nutritionist next week.  She has gained some weight - and I told her that is not good. She ought to be loosing weight.  I gave her a prescripton for golytely.  SLEEP APNEA IN ADULT (327.23  G47.33)  Impression: She is still getting studied to see if she needs O2.  Ovidio Kinavid Trinitey Roache, MD, [facility]FACS [facility] Pager: [phone] Office phone:  [phone]

## 2014-03-19 NOTE — Op Note (Signed)
Paige CitizenChristy J Anderson [id number] 1975-09-08 03/19/2014  Preoperative diagnosis: morbid obesity for lap roux en Y gastric bypass  Postoperative diagnosis: Same   Procedure: Upper endoscopy   Surgeon: Susy FrizzleMatt B. Daphine DeutscherMartin  M.D., FACS   Anesthesia: Gen.   Indications for procedure: 38 yo WF undergoing a laparoscopic roux en y gastric bypass and an upper endoscopy was requested to evaluate the anastomosis.  Description of procedure: After we have completed the new gastrojejunostomy, I scrubbed out and obtained the Olympus endoscope. I gently placed endoscope in the patient's oropharynx and gently glided it down the esophagus without any difficulty under direct visualization. Once I was in the gastric pouch, I insufflated the pouch was air. The pouch was approximately 5 cm in size. I was able to cannulate and advanced the scope through the gastrojejunostomy. Dr. Ezzard StandingNewman had placed saline in the upper abdomen. Upon further insufflation of the gastric pouch there was no evidence of bubbles. Upon further inspection of the gastric pouch, the mucosa appeared normal. There is no evidence of any mucosal abnormality. The gastric pouch and Roux limb were decompressed. The width of the gastrojejunal anastomosis was at least 1 cm. The scope was withdrawn. The patient tolerated this portion of the procedure well. Please see Dr Allene PyoNewman's operative note for details regarding the laparoscopic roux-en-y gastric bypass.  Matt B. Daphine DeutscherMartin, MD, FACS General, Bariatric, & Minimally Invasive Surgery [facility], [place]

## 2014-03-20 ENCOUNTER — Inpatient Hospital Stay (HOSPITAL_COMMUNITY): Payer: BC Managed Care – PPO

## 2014-03-20 ENCOUNTER — Encounter (HOSPITAL_COMMUNITY): Payer: Self-pay | Admitting: Surgery

## 2014-03-20 DIAGNOSIS — Z48812 Encounter for surgical aftercare following surgery on the circulatory system: Secondary | ICD-10-CM

## 2014-03-20 LAB — HEMOGLOBIN AND HEMATOCRIT, BLOOD
HCT: 25.5 % — ABNORMAL LOW (ref 36.0–46.0)
Hemoglobin: 7.7 g/dL — ABNORMAL LOW (ref 12.0–15.0)

## 2014-03-20 LAB — CBC WITH DIFFERENTIAL/PLATELET
BASOS ABS: 0 10*3/uL (ref 0.0–0.1)
Basophils Relative: 0 % (ref 0–1)
EOS ABS: 0.2 10*3/uL (ref 0.0–0.7)
EOS PCT: 2 % (ref 0–5)
HCT: 25.1 % — ABNORMAL LOW (ref 36.0–46.0)
Hemoglobin: 7.6 g/dL — ABNORMAL LOW (ref 12.0–15.0)
LYMPHS ABS: 1.8 10*3/uL (ref 0.7–4.0)
Lymphocytes Relative: 22 % (ref 12–46)
MCH: 23.9 pg — ABNORMAL LOW (ref 26.0–34.0)
MCHC: 30.3 g/dL (ref 30.0–36.0)
MCV: 78.9 fL (ref 78.0–100.0)
Monocytes Absolute: 0.7 10*3/uL (ref 0.1–1.0)
Monocytes Relative: 9 % (ref 3–12)
NEUTROS PCT: 67 % (ref 43–77)
Neutro Abs: 5.3 10*3/uL (ref 1.7–7.7)
PLATELETS: 276 10*3/uL (ref 150–400)
RBC: 3.18 MIL/uL — ABNORMAL LOW (ref 3.87–5.11)
RDW: 16.9 % — AB (ref 11.5–15.5)
WBC: 7.9 10*3/uL (ref 4.0–10.5)

## 2014-03-20 LAB — GLUCOSE, CAPILLARY
GLUCOSE-CAPILLARY: 109 mg/dL — AB (ref 70–99)
GLUCOSE-CAPILLARY: 124 mg/dL — AB (ref 70–99)
Glucose-Capillary: 107 mg/dL — ABNORMAL HIGH (ref 70–99)
Glucose-Capillary: 114 mg/dL — ABNORMAL HIGH (ref 70–99)
Glucose-Capillary: 85 mg/dL (ref 70–99)
Glucose-Capillary: 93 mg/dL (ref 70–99)

## 2014-03-20 MED ORDER — MORPHINE SULFATE 4 MG/ML IJ SOLN
INTRAMUSCULAR | Status: AC
  Filled 2014-03-20: qty 1

## 2014-03-20 MED ORDER — IOHEXOL 300 MG/ML  SOLN
50.0000 mL | Freq: Once | INTRAMUSCULAR | Status: AC | PRN
  Administered 2014-03-20: 10 mL via ORAL

## 2014-03-20 NOTE — Care Management Note (Signed)
    Page 1 of 1   03/20/2014     11:54:09 AM CARE MANAGEMENT NOTE 03/20/2014  Patient:  Paige Anderson,Paige Anderson   Account Number:  [id number]  Date Initiated:  03/20/2014  Documentation initiated by:  Lorenda IshiharaPEELE,Jetty Berland  Subjective/Objective Assessment:   38 yo female admitted s/p gastric bypass. PTA lived at home with family.     Action/Plan:   Home when stable   Anticipated DC Date:  03/23/2014   Anticipated DC Plan:  HOME/SELF CARE      DC Planning Services  CM consult      Choice offered to / List presented to:             Status of service:  Completed, signed off Medicare Important Message given?   (If response is "NO", the following Medicare IM given date fields will be blank) Date Medicare IM given:   Medicare IM given by:   Date Additional Medicare IM given:   Additional Medicare IM given by:    Discharge Disposition:  HOME/SELF CARE  Per UR Regulation:  Reviewed for med. necessity/level of care/duration of stay  If discussed at Long Length of Stay Meetings, dates discussed:    Comments:

## 2014-03-20 NOTE — Progress Notes (Signed)
General Surgery Note  LOS: 1 day  POD -  1 Day Post-Op  Assessment/Plan: 1.  LAPAROSCOPIC ROUX-EN-Y GASTRIC BYPASS WITH UPPER ENDOSCOPY - D. Ailin Rochford - 03/19/2014  Morbid obesity (weight 251, BMI of 49.2).  For swallow and doppler's today  2. GERD  3. DM x 1 year   HgbA1C - 6.6 on 08/08/2013  Glucose - 107 - 03/20/2014  4. Sleep apnea.   Has severe sleep apnea - saw Dr. Craige CottaSood on 02/01/2014 - on CPAP  5. Colonic diverticula on CT scan of 07/01/2102 6.  DVT prophylaxis - heparin 7.  Anemia  Preop Hgb - 9.6 - 03/16/2014  I was running her fluids at 200 cc/hour - will decrease to 150 cc/hr  Hgb today - 7.6 - 03/20/2014  Will recheck labs this afternoon   Active Problems:   Morbid obesity  Subjective:  She feels okay.  She says that her "glands" are swollen.  She is pointing to both sides of her neck.  We will hold anything orally until she gets her UGI.  She otherwise is doing okay. Objective:   Filed Vitals:   03/20/14 0523  BP: 113/77  Pulse: 80  Temp: 98.9 F (37.2 C)  Resp: 18     Intake/Output from previous day:  10/26 0701 - 10/27 0700 In: 3290 [I.V.:3290] Out: 350 [Urine:300; Blood:50]  Intake/Output this shift:      Physical Exam:   General: Obese WF who is alert and oriented.    HEENT: Normal. Pupils equal. .   Lungs: Clear   Abdomen: Soft   Wound: Clean   Lab Results:    Recent Labs  03/19/14 1715 03/20/14 0500  WBC  --  7.9  HGB 8.1* 7.6*  HCT 26.9* 25.1*  PLT  --  276    BMET  No results found for this basename: NA, K, CL, CO2, GLUCOSE, BUN, CREATININE, CALCIUM,  in the last 72 hours  PT/INR  No results found for this basename: LABPROT, INR,  in the last 72 hours  ABG  No results found for this basename: PHART, PCO2, PO2, HCO3,  in the last 72 hours   Studies/Results:  No results found.   Anti-infectives:   Anti-infectives   Start     Dose/Rate Route Frequency Ordered Stop   03/19/14 0930  cefOXitin (MEFOXIN) 2 g in dextrose 5 % 50 mL  IVPB     2 g 100 mL/hr over 30 Minutes Intravenous  Once 03/19/14 0918 03/19/14 0925   03/19/14 0525  cefOXitin (MEFOXIN) 2 g in dextrose 5 % 50 mL IVPB     2 g 100 mL/hr over 30 Minutes Intravenous On call to O.R. 03/19/14 0525 03/19/14 0725      Ovidio Kinavid Katia Hannen, MD, FACS Pager: [phone] [facility] Office: [phone] 03/20/2014

## 2014-03-20 NOTE — Progress Notes (Signed)
VASCULAR LAB PRELIMINARY  PRELIMINARY  PRELIMINARY  PRELIMINARY  Bilateral lower extremity venous duplex completed.    Preliminary report: Bilateral:  No evidence of DVT, superficial thrombosis, or Baker's Cyst.   Ambriel Gorelick, RVTS 03/20/2014, 1:56 PM

## 2014-03-20 NOTE — Progress Notes (Signed)
RT checked with patient about her home CPAP. Patient stated she did not need assistance and would self administer. RT encouraged patient to let RN know if she needed RT's assistance. RT will assess and monitor as needed.

## 2014-03-20 NOTE — Progress Notes (Signed)
Patient alert and oriented, Post op day 1.  Provided support and encouragement.  Encouraged pulmonary toilet, ambulation and small sips of liquids when swallow study returned satisfactorily.  All questions answered.  Will continue to monitor. 

## 2014-03-21 LAB — GLUCOSE, CAPILLARY
GLUCOSE-CAPILLARY: 82 mg/dL (ref 70–99)
Glucose-Capillary: 70 mg/dL (ref 70–99)
Glucose-Capillary: 80 mg/dL (ref 70–99)
Glucose-Capillary: 81 mg/dL (ref 70–99)
Glucose-Capillary: 85 mg/dL (ref 70–99)
Glucose-Capillary: 87 mg/dL (ref 70–99)

## 2014-03-21 LAB — CBC WITH DIFFERENTIAL/PLATELET
BASOS ABS: 0 10*3/uL (ref 0.0–0.1)
Basophils Relative: 0 % (ref 0–1)
EOS PCT: 5 % (ref 0–5)
Eosinophils Absolute: 0.3 10*3/uL (ref 0.0–0.7)
HEMATOCRIT: 23.1 % — AB (ref 36.0–46.0)
HEMOGLOBIN: 7 g/dL — AB (ref 12.0–15.0)
LYMPHS PCT: 22 % (ref 12–46)
Lymphs Abs: 1.5 10*3/uL (ref 0.7–4.0)
MCH: 23.3 pg — ABNORMAL LOW (ref 26.0–34.0)
MCHC: 30.3 g/dL (ref 30.0–36.0)
MCV: 77 fL — AB (ref 78.0–100.0)
MONO ABS: 0.5 10*3/uL (ref 0.1–1.0)
MONOS PCT: 7 % (ref 3–12)
NEUTROS ABS: 4.4 10*3/uL (ref 1.7–7.7)
Neutrophils Relative %: 66 % (ref 43–77)
Platelets: 288 10*3/uL (ref 150–400)
RBC: 3 MIL/uL — ABNORMAL LOW (ref 3.87–5.11)
RDW: 16.8 % — AB (ref 11.5–15.5)
WBC: 6.7 10*3/uL (ref 4.0–10.5)

## 2014-03-21 NOTE — Plan of Care (Signed)
Problem: Food- and Nutrition-Related Knowledge Deficit (NB-1.1) Goal: Nutrition education Formal process to instruct or train a patient/client in a skill or to impart knowledge to help patients/clients voluntarily manage or modify food choices and eating behavior to maintain or improve health. Outcome: Completed/Met Date Met:  03/21/14 Nutrition Education Note  Received consult for diet education per DROP protocol.   S/P 10/26 LAPAROSCOPIC ROUX-EN-Y GASTRIC BYPASS WITH UPPER ENDOSCOPY   Discussed 2 week post op diet with pt. Emphasized that liquids must be non carbonated, non caffeinated, and sugar free. Fluid goals discussed. Pt to follow up with outpatient bariatric RD for further diet progression after 2 weeks. Multivitamins and minerals also reviewed. Teach back method used, pt expressed understanding, expect good compliance.   Diet: First 2 Weeks  You will see the nutritionist about two (2) weeks after your surgery. The nutritionist will increase the types of foods you can eat if you are handling liquids well:  If you have severe vomiting or nausea and cannot handle clear liquids lasting longer than 1 day, call your surgeon  Protein Shake  Drink at least 2 ounces of shake 5-6 times per day  Each serving of protein shakes (usually 8 - 12 ounces) should have a minimum of:  15 grams of protein  And no more than 5 grams of carbohydrate  Goal for protein each day:  Men = 80 grams per day  Women = 60 grams per day  Protein powder may be added to fluids such as non-fat milk or Lactaid milk or Soy milk (limit to 35 grams added protein powder per serving)   Hydration  Slowly increase the amount of water and other clear liquids as tolerated (See Acceptable Fluids)  Slowly increase the amount of protein shake as tolerated  Sip fluids slowly and throughout the day  May use sugar substitutes in small amounts (no more than 6 - 8 packets per day; i.e. Splenda)   Fluid Goal  The first goal is  to drink at least 8 ounces of protein shake/drink per day (or as directed by the nutritionist); some examples of protein shakes are [employer], [employer], EAS Edge HP, and Unjury. See handout from pre-op Bariatric Education Class:  Slowly increase the amount of protein shake you drink as tolerated  You may find it easier to slowly sip shakes throughout the day  It is important to get your proteins in first  Your fluid goal is to drink 64 - 100 ounces of fluid daily  It may take a few weeks to build up to this  32 oz (or more) should be clear liquids  And  32 oz (or more) should be full liquids (see below for examples)  Liquids should not contain sugar, caffeine, or carbonation   Clear Liquids:  Water or Sugar-free flavored water (i.e. Fruit H2O, Propel)  Decaffeinated coffee or tea (sugar-free)  Crystal Lite, Wyler's Lite, Minute Maid Lite  Sugar-free Jell-O  Bouillon or broth  Sugar-free Popsicle: *Less than 20 calories each; Limit 1 per day   Full Liquids:  Protein Shakes/Drinks + 2 choices per day of other full liquids  Full liquids must be:  No More Than 12 grams of Carbs per serving  No More Than 3 grams of Fat per serving  Strained low-fat cream soup  Non-Fat milk  Fat-free Lactaid Milk  Sugar-free yogurt (Dannon Lite & Fit, Greek yogurt)     Clayton Bibles, MS, RD, LDN Pager: [phone] After Hours Pager: [phone]

## 2014-03-21 NOTE — Plan of Care (Signed)
Problem: Phase I Progression Outcomes Goal: Hemodynamically stable Outcome: Progressing Hemoglobin low recheck in am, patient asymptomaic

## 2014-03-21 NOTE — Progress Notes (Signed)
General Surgery Note  LOS: 2 days  POD -  2 Days Post-Op  Assessment/Plan: 1.  LAPAROSCOPIC ROUX-EN-Y GASTRIC BYPASS WITH UPPER ENDOSCOPY - D. Lorea Kupfer - 03/19/2014  Morbid obesity (weight 251, BMI of 49.2).  Swallow okay, though slow emptyting.  Tolerated water, will increase to protein drinks.  2. GERD  3. DM x 1 year   HgbA1C - 6.6 on 08/08/2013  Glucose - 85 - 03/21/2014  4. Sleep apnea.   Has severe sleep apnea - saw Dr. Craige CottaSood on 02/01/2014 - on CPAP  5. Colonic diverticula on CT scan of 07/01/2102 6.  DVT prophylaxis - heparin 7.  Anemia  Preop Hgb - 9.6 - 03/16/2014  Hgb today - 7.0 - 03/21/2014  Because of anemia - will keep one more day and recheck labs tomorrow   Active Problems:   Morbid obesity  Subjective:  She is doing well.  No complaints.  No BM yet. Objective:   Filed Vitals:   03/21/14 0600  BP: 104/67  Pulse: 76  Temp: 98 F (36.7 C)  Resp: 18     Intake/Output from previous day:  10/27 0701 - 10/28 0700 In: 3850 [I.V.:3850] Out: 1550 [Urine:1550]  Intake/Output this shift:      Physical Exam:   General: Obese WF who is alert and oriented.    HEENT: Normal. Pupils equal. .   Lungs: Clear   Abdomen: Soft.  BS present.   Wound: Clean   Lab Results:     Recent Labs  03/20/14 0500 03/20/14 1602 03/21/14 0425  WBC 7.9  --  6.7  HGB 7.6* 7.7* 7.0*  HCT 25.1* 25.5* 23.1*  PLT 276  --  288    BMET  No results found for this basename: NA, K, CL, CO2, GLUCOSE, BUN, CREATININE, CALCIUM,  in the last 72 hours  PT/INR  No results found for this basename: LABPROT, INR,  in the last 72 hours  ABG  No results found for this basename: PHART, PCO2, PO2, HCO3,  in the last 72 hours   Studies/Results:  Dg Ugi W/water Sol Cm  03/20/2014   CLINICAL DATA:  Postop gastric bypass  EXAM: WATER SOLUBLE UPPER GI SERIES  TECHNIQUE: Single-column upper GI series was performed using water soluble contrast.  CONTRAST:  10mL OMNIPAQUE IOHEXOL 300 MG/ML  SOLN   COMPARISON:  12/27/2013  FLUOROSCOPY TIME:  33 seconds  FINDINGS: Scout radiograph demonstrates surgical sutures in the left upper abdomen.  Normal gastric pouch.  No evidence of leak.  Narrowing at the gastrojejunostomy, likely reflecting postoperative edema. Some contrast does pass into the jejunum after a brief delay.  Moderate gastroesophageal reflux.  IMPRESSION: No evidence of leak.  Narrowing at the gastrojejunostomy, likely reflecting postoperative edema, although patent.  Moderate gastroesophageal reflux.   Electronically Signed   By: Charline BillsSriyesh  Krishnan M.D.   On: 03/20/2014 10:54     Anti-infectives:   Anti-infectives   Start     Dose/Rate Route Frequency Ordered Stop   03/19/14 0930  cefOXitin (MEFOXIN) 2 g in dextrose 5 % 50 mL IVPB     2 g 100 mL/hr over 30 Minutes Intravenous  Once 03/19/14 0918 03/19/14 0925   03/19/14 0525  cefOXitin (MEFOXIN) 2 g in dextrose 5 % 50 mL IVPB     2 g 100 mL/hr over 30 Minutes Intravenous On call to O.R. 03/19/14 0525 03/19/14 0725      Ovidio Kinavid Nathaneil Feagans, MD, FACS Pager: [phone] [facility] Office: [phone] 03/21/2014

## 2014-03-22 LAB — CBC WITH DIFFERENTIAL/PLATELET
Basophils Absolute: 0 10*3/uL (ref 0.0–0.1)
Basophils Relative: 0 % (ref 0–1)
EOS ABS: 0.3 10*3/uL (ref 0.0–0.7)
EOS PCT: 5 % (ref 0–5)
HCT: 23.2 % — ABNORMAL LOW (ref 36.0–46.0)
HEMOGLOBIN: 7.1 g/dL — AB (ref 12.0–15.0)
LYMPHS ABS: 1.5 10*3/uL (ref 0.7–4.0)
Lymphocytes Relative: 26 % (ref 12–46)
MCH: 23.8 pg — AB (ref 26.0–34.0)
MCHC: 30.6 g/dL (ref 30.0–36.0)
MCV: 77.9 fL — AB (ref 78.0–100.0)
MONOS PCT: 7 % (ref 3–12)
Monocytes Absolute: 0.4 10*3/uL (ref 0.1–1.0)
Neutro Abs: 3.7 10*3/uL (ref 1.7–7.7)
Neutrophils Relative %: 62 % (ref 43–77)
Platelets: 292 10*3/uL (ref 150–400)
RBC: 2.98 MIL/uL — AB (ref 3.87–5.11)
RDW: 16.7 % — ABNORMAL HIGH (ref 11.5–15.5)
WBC: 5.9 10*3/uL (ref 4.0–10.5)

## 2014-03-22 LAB — GLUCOSE, CAPILLARY
Glucose-Capillary: 73 mg/dL (ref 70–99)
Glucose-Capillary: 74 mg/dL (ref 70–99)
Glucose-Capillary: 77 mg/dL (ref 70–99)

## 2014-03-22 NOTE — Discharge Summary (Signed)
Physician Discharge Summary  Patient ID:  Paige Anderson  MRN: [id number]  DOB/AGE: 40-Apr-1977 40 y.o.  Admit date: 03/19/2014 Discharge date: 03/22/2014  Discharge Diagnoses:  1.  Morbid obesity (weight 251, BMI of 49.2).   2. GERD  3. DM x 1 year   HgbA1C - 6.6 on 08/08/2013  4. Sleep apnea.   Has severe sleep apnea - saw Dr. Craige CottaSood on 02/01/2014 - on CPAP  5. Colonic diverticula on CT scan of 07/01/2102  6. DVT prophylaxis - heparin  7. Anemia   Preop Hgb - 9.6 - 03/16/2014   Hgb today - 7.0 - 03/21/2014    Active Problems:   Morbid obesity  Operation: Procedure(s): LAPAROSCOPIC ROUX-EN-Y GASTRIC BYPASS WITH UPPER ENDOSCOPY on 03/19/2014 - D. Ezzard StandingNewman  Discharged Condition: good  Hospital Course: Paige CitizenChristy J Anderson is an [age] y.o. female whose primary care physician is Bosie ClosICE,KATHLEEN M, MD and who was admitted 03/19/2014 with a chief complaint of morbid obesity.   She was brought to the operating room on 03/19/2014 and underwent  LAPAROSCOPIC ROUX-EN-Y GASTRIC BYPASS WITH UPPER ENDOSCOPY.   The discharge instructions were reviewed with the patient.  Consults: None  Significant Diagnostic Studies: Results for orders placed during the hospital encounter of 03/19/14  PREGNANCY, URINE      Result Value Ref Range   Preg Test, Ur NEGATIVE  NEGATIVE  GLUCOSE, CAPILLARY      Result Value Ref Range   Glucose-Capillary 103 (*) 70 - 99 mg/dL   Comment 1 Documented in Chart     Comment 2 Notify RN    GLUCOSE, CAPILLARY      Result Value Ref Range   Glucose-Capillary 164 (*) 70 - 99 mg/dL   Comment 1 Notify RN    HEMOGLOBIN AND HEMATOCRIT, BLOOD      Result Value Ref Range   Hemoglobin 8.1 (*) 12.0 - 15.0 g/dL   HCT 40.926.9 (*) 81.136.0 - 91.446.0 %  CBC WITH DIFFERENTIAL      Result Value Ref Range   WBC 7.9  4.0 - 10.5 K/uL   RBC 3.18 (*) 3.87 - 5.11 MIL/uL   Hemoglobin 7.6 (*) 12.0 - 15.0 g/dL   HCT 78.225.1 (*) 95.636.0 - 21.346.0 %   MCV 78.9  78.0 - 100.0 fL   MCH 23.9 (*) 26.0 - 34.0 pg   MCHC 30.3  30.0 - 36.0 g/dL   RDW 08.616.9 (*) 57.811.5 - 46.915.5 %   Platelets 276  150 - 400 K/uL   Neutrophils Relative % 67  43 - 77 %   Neutro Abs 5.3  1.7 - 7.7 K/uL   Lymphocytes Relative 22  12 - 46 %   Lymphs Abs 1.8  0.7 - 4.0 K/uL   Monocytes Relative 9  3 - 12 %   Monocytes Absolute 0.7  0.1 - 1.0 K/uL   Eosinophils Relative 2  0 - 5 %   Eosinophils Absolute 0.2  0.0 - 0.7 K/uL   Basophils Relative 0  0 - 1 %   Basophils Absolute 0.0  0.0 - 0.1 K/uL  GLUCOSE, CAPILLARY      Result Value Ref Range   Glucose-Capillary 118 (*) 70 - 99 mg/dL  GLUCOSE, CAPILLARY      Result Value Ref Range   Glucose-Capillary 117 (*) 70 - 99 mg/dL  HEMOGLOBIN AND HEMATOCRIT, BLOOD      Result Value Ref Range   Hemoglobin 7.7 (*) 12.0 - 15.0 g/dL   HCT 62.925.5 (*) 52.836.0 -  46.0 %  GLUCOSE, CAPILLARY      Result Value Ref Range   Glucose-Capillary 124 (*) 70 - 99 mg/dL  GLUCOSE, CAPILLARY      Result Value Ref Range   Glucose-Capillary 107 (*) 70 - 99 mg/dL  GLUCOSE, CAPILLARY      Result Value Ref Range   Glucose-Capillary 114 (*) 70 - 99 mg/dL  GLUCOSE, CAPILLARY      Result Value Ref Range   Glucose-Capillary 109 (*) 70 - 99 mg/dL  CBC WITH DIFFERENTIAL      Result Value Ref Range   WBC 6.7  4.0 - 10.5 K/uL   RBC 3.00 (*) 3.87 - 5.11 MIL/uL   Hemoglobin 7.0 (*) 12.0 - 15.0 g/dL   HCT 16.123.1 (*) 09.636.0 - 04.546.0 %   MCV 77.0 (*) 78.0 - 100.0 fL   MCH 23.3 (*) 26.0 - 34.0 pg   MCHC 30.3  30.0 - 36.0 g/dL   RDW 40.916.8 (*) 81.111.5 - 91.415.5 %   Platelets 288  150 - 400 K/uL   Neutrophils Relative % 66  43 - 77 %   Neutro Abs 4.4  1.7 - 7.7 K/uL   Lymphocytes Relative 22  12 - 46 %   Lymphs Abs 1.5  0.7 - 4.0 K/uL   Monocytes Relative 7  3 - 12 %   Monocytes Absolute 0.5  0.1 - 1.0 K/uL   Eosinophils Relative 5  0 - 5 %   Eosinophils Absolute 0.3  0.0 - 0.7 K/uL   Basophils Relative 0  0 - 1 %   Basophils Absolute 0.0  0.0 - 0.1 K/uL  GLUCOSE, CAPILLARY      Result Value Ref Range   Glucose-Capillary 85   70 - 99 mg/dL  GLUCOSE, CAPILLARY      Result Value Ref Range   Glucose-Capillary 93  70 - 99 mg/dL  GLUCOSE, CAPILLARY      Result Value Ref Range   Glucose-Capillary 87  70 - 99 mg/dL  GLUCOSE, CAPILLARY      Result Value Ref Range   Glucose-Capillary 82  70 - 99 mg/dL  GLUCOSE, CAPILLARY      Result Value Ref Range   Glucose-Capillary 85  70 - 99 mg/dL  GLUCOSE, CAPILLARY      Result Value Ref Range   Glucose-Capillary 80  70 - 99 mg/dL  GLUCOSE, CAPILLARY      Result Value Ref Range   Glucose-Capillary 81  70 - 99 mg/dL  CBC WITH DIFFERENTIAL      Result Value Ref Range   WBC 5.9  4.0 - 10.5 K/uL   RBC 2.98 (*) 3.87 - 5.11 MIL/uL   Hemoglobin 7.1 (*) 12.0 - 15.0 g/dL   HCT 78.223.2 (*) 95.636.0 - 21.346.0 %   MCV 77.9 (*) 78.0 - 100.0 fL   MCH 23.8 (*) 26.0 - 34.0 pg   MCHC 30.6  30.0 - 36.0 g/dL   RDW 08.616.7 (*) 57.811.5 - 46.915.5 %   Platelets 292  150 - 400 K/uL   Neutrophils Relative % 62  43 - 77 %   Neutro Abs 3.7  1.7 - 7.7 K/uL   Lymphocytes Relative 26  12 - 46 %   Lymphs Abs 1.5  0.7 - 4.0 K/uL   Monocytes Relative 7  3 - 12 %   Monocytes Absolute 0.4  0.1 - 1.0 K/uL   Eosinophils Relative 5  0 - 5 %   Eosinophils Absolute 0.3  0.0 - 0.7 K/uL   Basophils Relative 0  0 - 1 %   Basophils Absolute 0.0  0.0 - 0.1 K/uL  GLUCOSE, CAPILLARY      Result Value Ref Range   Glucose-Capillary 70  70 - 99 mg/dL  GLUCOSE, CAPILLARY      Result Value Ref Range   Glucose-Capillary 77  70 - 99 mg/dL  GLUCOSE, CAPILLARY      Result Value Ref Range   Glucose-Capillary 73  70 - 99 mg/dL    Dg Ugi W/water Sol Cm  03/20/2014   CLINICAL DATA:  Postop gastric bypass  EXAM: WATER SOLUBLE UPPER GI SERIES  TECHNIQUE: Single-column upper GI series was performed using water soluble contrast.  CONTRAST:  10mL OMNIPAQUE IOHEXOL 300 MG/ML  SOLN  COMPARISON:  12/27/2013  FLUOROSCOPY TIME:  33 seconds  FINDINGS: Scout radiograph demonstrates surgical sutures in the left upper abdomen.  Normal gastric  pouch.  No evidence of leak.  Narrowing at the gastrojejunostomy, likely reflecting postoperative edema. Some contrast does pass into the jejunum after a brief delay.  Moderate gastroesophageal reflux.  IMPRESSION: No evidence of leak.  Narrowing at the gastrojejunostomy, likely reflecting postoperative edema, although patent.  Moderate gastroesophageal reflux.   Electronically Signed   By: Charline Bills M.D.   On: 03/20/2014 10:54   Discharge Exam:  Filed Vitals:   03/22/14 0600  BP: 97/54  Pulse: 78  Temp: 98.2 F (36.8 C)  Resp: 18   General: Obese WF who is alert and generally healthy appearing.  Lungs: Clear to auscultation and symmetric breath sounds. Heart:  RRR. No murmur or rub. Abdomen: Soft.  Normal bowel sounds.  Wounds look good.  Discharge Medications:     Medication List         acetaminophen 500 MG tablet  Commonly known as:  TYLENOL  Take 1,000 mg by mouth every 8 (eight) hours as needed for mild pain or headache.     escitalopram 10 MG tablet  Commonly known as:  LEXAPRO  Take 10 mg by mouth daily.     fluticasone 50 MCG/ACT nasal spray  Commonly known as:  FLONASE  Place 2 sprays into both nostrils daily as needed for allergies or rhinitis.     ibuprofen 200 MG tablet  Commonly known as:  ADVIL,MOTRIN  Take 400 mg by mouth every 6 (six) hours as needed for headache.     metFORMIN 500 MG tablet  Commonly known as:  GLUCOPHAGE  Take 500 mg by mouth daily with breakfast.     omeprazole 40 MG capsule  Commonly known as:  PRILOSEC  Take 40 mg by mouth every morning.        Disposition: 01-Home or Self Care      Discharge Instructions   Increase activity slowly    Complete by:  As directed           Activity:  Driving - Do not drive while on pain meds   Lifting - Take it easy for 1 week, then no limit  Wound Care:   May shower  Diet:  Post gastric bypass diet  Follow up appointment:  Call Dr. Allene Pyo office [facility])  at [phone] for an appointment in 2 to 3 weeks.  Medications and dosages:  Resume your home medications.  You have a prescription for:  Oxycodone elixir   Signed: Ovidio Kin, M.D., [facility] Office:  [phone]  03/22/2014, 7:06 AM

## 2014-03-22 NOTE — Discharge Instructions (Signed)
[facility] - DISCHARGE INSTRUCTIONS TO PATIENT  Activity:  Driving - Do not drive while on pain meds   Lifting - Take it easy for 1 week, then no limit  Wound Care:   May shower  Diet:  Post gastric bypass diet  Follow up appointment:  Call Dr. Allene PyoNewman's office [facility]) at [phone] for an appointment in 2 to 3 weeks.  Medications and dosages:  Resume your home medications.  You have a prescription for:  Oxycodone elixir  Call Dr. Ezzard StandingNewman or his office  [phone]) if you have:  Temperature greater than 100.4,  Persistent nausea and vomiting,  Severe uncontrolled pain,  Redness, tenderness, or signs of infection (pain, swelling, redness, odor or green/yellow discharge around the site),  Difficulty breathing, headache or visual disturbances,  Any other questions or concerns you may have after discharge.  In an emergency, call 911 or go to an Emergency Department at a nearby hospital.       GASTRIC BYPASS/SLEEVE  Home Care Instructions   These instructions are to help you care for yourself when you go home.  Call: If you have any problems. Call [phone] and ask for the surgeon on call If you need immediate assistance [facility]. Tell the ER staff you are a new post-op gastric bypass or gastric sleeve patient  Signs and symptoms to report: Severe  vomiting or nausea If you cannot handle clear liquids for longer than 1 day, call your surgeon Abdominal pain which does not get better after taking your pain medication Fever greater than 100.4  F and chills Heart rate over 100 beats a minute Trouble breathing Chest pain Redness,  swelling, drainage, or foul odor at incision (surgical) sites If your incisions open or pull apart Swelling or pain in calf (lower leg) Diarrhea (Loose bowel movements that happen often), frequent watery, uncontrolled bowel movements Constipation, (no bowel movements for 3 days) if this  happens: Take Milk of Magnesia, 2 tablespoons by mouth, 3 times a day for 2 days if needed Stop taking Milk of Magnesia once you have had a bowel movement Call your doctor if constipation continues Or Take Miralax  (instead of Milk of Magnesia) following the label instructions Stop taking Miralax once you have had a bowel movement Call your doctor if constipation continues Anything you think is abnormal for you   Normal side effects after surgery: Unable to sleep at night or unable to concentrate Irritability Being tearful (crying) or depressed  These are common complaints, possibly related to your anesthesia, stress of surgery, and change in lifestyle, that usually go away a few weeks after surgery. If these feelings continue, call your medical doctor.  Wound Care: You may have surgical glue, steri-strips, or staples over your incisions after surgery Surgical glue: Looks like clear film over your incisions and will wear off a little at a time Steri-strips: Adhesive strips of tape over your incisions. You may notice a yellowish color on skin under the steri-strips. This is used to make the steri-strips stick better. Do not pull the steri-strips off - let them fall off Staples: Staples may be removed before you leave the hospital If you go home with staples, call [facility] for an appointment with your surgeons nurse to have staples removed 10 days after surgery, [phone] Showering: You may shower two (2) days after your surgery unless your surgeon tells you differently Wash gently around incisions with warm soapy water, rinse  well, and gently pat dry If you have a drain (tube from your incision), you may need someone to hold this while you shower No tub baths until staples are removed and incisions are healed   Medications: Medications should be liquid or crushed if larger than the size of a dime Extended release pills (medication that releases a little bit at a time  through the  day) should not be crushed Depending on the size and number of medications you take, you may need to space (take a few throughout the day)/change the time you take your medications so that you do not over-fill your pouch (smaller stomach) Make sure you follow-up with you primary care physician to make medication changes needed during rapid weight loss and life -style changes If you have diabetes, follow up with your doctor that orders your diabetes medication(s) within one week after surgery and check your blood sugar regularly  Do not drive while taking narcotics (pain medications)  Do not take acetaminophen (Tylenol) and Roxicet or Lortab Elixir at the same time since these pain medications contain acetaminophen   Diet:  First 2 Weeks You will see the nutritionist about two (2) weeks after your surgery. The nutritionist will increase the types of foods you can eat if you are handling liquids well: If you have severe vomiting or nausea and cannot handle clear liquids lasting longer than 1 day call your surgeon Protein Shake Drink at least 2 ounces of shake 5-6 times per day Each serving of protein shakes (usually 8-12 ounces) should have a minimum of: 15 grams of protein And no more than 5 grams of carbohydrate Goal for protein each day: Men = 80 grams per day Women = 60 grams per day    Protein powder may be added to fluids such as non-fat milk or Lactaid milk or Soy milk (limit to 35 grams added protein powder per serving)  Hydration Slowly increase the amount of water and other clear liquids as tolerated (See Acceptable Fluids) Slowly increase the amount of protein shake as tolerated Sip fluids slowly and throughout the day May use sugar substitutes in small amounts (no more than 6-8 packets per day; i.e. Splenda)  Fluid Goal The first goal is to drink at least 8 ounces of protein shake/drink per day (or as directed by the nutritionist); some examples of protein shakes  are [employer], [employer], EAS Edge HP, and Unjury. - See handout from pre-op Bariatric Education Class: Slowly increase the amount of protein shake you drink as tolerated You may find it easier to slowly sip shakes throughout the day It is important to get your proteins in first Your fluid goal is to drink 64-100 ounces of fluid daily It may take a few weeks to build up to this  32 oz. (or more) should be clear liquids And 32 oz. (or more) should be full liquids (see below for examples) Liquids should not contain sugar, caffeine, or carbonation  Clear Liquids: Water of Sugar-free flavored water (i.e. Fruit HO, Propel) Decaffeinated coffee or tea (sugar-free) Crystal lite, Wylers Lite, Minute Maid Lite Sugar-free Jell-O Bouillon or broth Sugar-free Popsicle:    - Less than 20 calories each; Limit 1 per day  Full Liquids:                   Protein Shakes/Drinks + 2 choices per day of other full liquids Full liquids must be: No More Than 12 grams of Carbs per serving No More Than  3 grams of Fat per serving Strained low-fat cream soup Non-Fat milk Fat-free Lactaid Milk Sugar-free yogurt (Dannon Lite & Fit, Greek yogurt)    Vitamins and Minerals Start 1 day after surgery unless otherwise directed by your surgeon 2 Chewable Multivitamin / Multimineral Supplement with iron (i.e. Centrum for Adults) Vitamin B-12, 350-500 micrograms sub-lingual (place tablet under the tongue) each day Chewable Calcium Citrate with Vitamin D-3 (Example: 3 Chewable Calcium  Plus 600 with Vitamin D-3) Take 500 mg three (3) times a day for a total of 1500 mg each day Do not take all 3 doses of calcium at one time as it may cause constipation, and you can only absorb 500 mg at a time Do not mix multivitamins containing iron with calcium supplements;  take 2 hours apart Do not substitute Tums (calcium carbonate) for your calcium Menstruating women and those at risk for anemia ( a blood disease  that causes weakness) may need extra iron Talk to your doctor to see if you need more iron If you need extra iron: Total daily Iron recommendation (including Vitamins) is 50 to 100 mg Iron/day Do not stop taking or change any vitamins or minerals until you talk to your nutritionist or surgeon Your nutritionist and/or surgeon must approve all vitamin and mineral supplements   Activity and Exercise: It is important to continue walking at home. Limit your physical activity as instructed by your doctor. During this time, use these guidelines: Do not lift anything greater than ten  (10) pounds for at least two (2) weeks Do not go back to work or drive until Designer, industrial/productyour surgeon says you can You may have sex when you feel comfortable It is VERY important for female patients to use a reliable birth control method; fertility often increase after surgery Do not get pregnant for at least 18 months Start exercising as soon as your doctor tells you that you can Make sure your doctor approves any physical activity Start with a simple walking program Walk 5-15 minutes each day, 7 days per week Slowly increase until you are walking 30-45 minutes per day Consider joining our BELT program. [phone] or email [email or web address]   Special Instructions Things to remember: Free counseling is available for you and your family through collaboration between [facility] and [facility]. Please call [phone] and leave a message Use your CPAP when sleeping if this applies to you Consider buying a medical alert bracelet that says you had lap-band surgery     You will likely have your first fill (fluid added to your band) 6 - 8 weeks after surgery [facility] has a free Bariatric Surgery Support Group that meets monthly, the 3rd Thursday, 6pm. [facility] Classrooms. You can see classes online at [email or web address] It is very important to keep all follow up appointments with your surgeon,  nutritionist, primary care physician, and behavioral health practitioner After the first year, please follow up with your bariatric surgeon and nutritionist at least once a year in order to maintain best weight loss results                    [facility]:  [phone]               [facility]: [phone]               Bariatric Nurse Coordinator: [phone]  Gastric Bypass/Sleeve Home Care Instructions  Rev. 06/2012  Reviewed and Endorsed °                                                   by [facility] Patient Education Committee, Jan, 2014 ° ° ° ° ° ° ° ° ° °

## 2014-03-22 NOTE — Progress Notes (Signed)
Patient alert and oriented, pain is controlled. Patient is tolerating fluids,  advanced to protein shake yesterday, patient tolerated well. Reviewed Gastric Bypass discharge instructions with patient and patient is able to articulate understanding. Provided information on BELT program, Support Group and [facility] outpatient pharmacy. All questions answered, will continue to monitor.    

## 2014-03-23 ENCOUNTER — Telehealth (HOSPITAL_COMMUNITY): Payer: Self-pay

## 2014-03-23 NOTE — Telephone Encounter (Signed)

## 2014-04-03 ENCOUNTER — Encounter: Payer: BC Managed Care – PPO | Attending: Surgery

## 2014-04-03 VITALS — Ht 60.0 in | Wt 233.0 lb

## 2014-04-03 DIAGNOSIS — E119 Type 2 diabetes mellitus without complications: Secondary | ICD-10-CM | POA: Insufficient documentation

## 2014-04-03 DIAGNOSIS — Z713 Dietary counseling and surveillance: Secondary | ICD-10-CM | POA: Diagnosis not present

## 2014-04-03 DIAGNOSIS — G4733 Obstructive sleep apnea (adult) (pediatric): Secondary | ICD-10-CM

## 2014-04-03 NOTE — Patient Instructions (Signed)
Patient to follow Phase 3A-Soft, High Protein Diet and follow-up at [facility] in 6 weeks for 2 months post-op nutrition visit for diet advancement. 

## 2014-04-03 NOTE — Progress Notes (Signed)
Bariatric Class:  Appt start time: 1530 end time:  1630.  2 Week Post-Operative Nutrition Class  Patient was seen on 04/03/14 for Post-Operative Nutrition education at [facility].   Surgery date: 03/19/2014 Surgery type: RYGB Start weight at [facility]: 252 lbs on 01/09/2014 Weight today: 233.0lbs Weigh change: 17 lbs  TANITA  BODY COMP RESULTS  02/26/14 04/03/14   BMI (kg/m^2) 48.8 45.5   Fat Mass (lbs) 128.0 110.5   Fat Free Mass (lbs) 122.0 122.5   Total Body Water (lbs) 89.5 89.5    The following the learning objectives were met by the patient during this course:  Identifies Phase 3A (Soft, High Proteins) Dietary Goals and will begin from 2 weeks post-operatively to 2 months post-operatively  Identifies appropriate sources of fluids and proteins   States protein recommendations and appropriate sources post-operatively  Identifies the need for appropriate texture modifications, mastication, and bite sizes when consuming solids  Identifies appropriate multivitamin and calcium sources post-operatively  Describes the need for physical activity post-operatively and will follow MD recommendations  States when to call healthcare provider regarding medication questions or post-operative complications  Handouts given during class include:  Phase 3A: Soft, High Protein Diet Handout  Follow-Up Plan: Patient will follow-up at [facility] in 6 weeks for 2 month post-op nutrition visit for diet advancement per MD.

## 2014-04-05 ENCOUNTER — Other Ambulatory Visit (INDEPENDENT_AMBULATORY_CARE_PROVIDER_SITE_OTHER): Payer: Self-pay | Admitting: Surgery

## 2014-04-05 ENCOUNTER — Other Ambulatory Visit (INDEPENDENT_AMBULATORY_CARE_PROVIDER_SITE_OTHER): Payer: Self-pay | Admitting: *Deleted

## 2014-04-05 DIAGNOSIS — K909 Intestinal malabsorption, unspecified: Secondary | ICD-10-CM

## 2014-04-05 DIAGNOSIS — Z9884 Bariatric surgery status: Secondary | ICD-10-CM

## 2014-05-08 ENCOUNTER — Ambulatory Visit: Payer: BC Managed Care – PPO | Admitting: Pulmonary Disease

## 2014-05-16 ENCOUNTER — Encounter: Payer: BC Managed Care – PPO | Attending: Surgery | Admitting: Dietician

## 2014-05-16 DIAGNOSIS — Z713 Dietary counseling and surveillance: Secondary | ICD-10-CM | POA: Diagnosis not present

## 2014-05-16 DIAGNOSIS — E119 Type 2 diabetes mellitus without complications: Secondary | ICD-10-CM | POA: Insufficient documentation

## 2014-05-16 NOTE — Progress Notes (Signed)
  Follow-up visit:  8 Weeks Post-Operative RYGB Surgery  Medical Nutrition Therapy:  Appt start time: 1615 end time:  1645.  Primary concerns today: Post-operative Bariatric Surgery Nutrition Management. Paige Anderson returns with a 28 lb weight loss. Eating a banana for breakfast. Has trouble getting in chicken or steak. Not having protein shakes. Not feeling hungry.   Does not like using protein shakes since she would rather eat protein. However, not meeting protein goal. Agreed to have protein shakes again.   Surgery date: 03/19/2014 Surgery type: RYGB [facility]: 252 lbs on 01/09/2014 Weight today: 205 lbs  Weigh change: 28 lbs  TANITA  BODY COMP RESULTS  02/26/14 04/03/14 05/16/14   BMI (kg/m^2) 48.8 45.5 40.0   Fat Mass (lbs) 128.0 110.5 85.0   Fat Free Mass (lbs) 122.0 122.5 120.0   Total Body Water (lbs) 89.5 89.5 88.0    Preferred Learning Style:   No preference indicated   Learning Readiness:   Ready  24-hr recall: B (AM): banana  Snk (AM): none L (PM): whole wheat pizza or 1 oz cheese with crackers (7-10 g) Snk (PM): none or cheese (0-6 g) D (PM): 1 oz cheese with crackers or  turkey/ham and cheese or pork chops (7-14 g) Snk (PM): might have SF popsicle  Fluid intake: 40 oz water  Estimated total protein intake: 14-30 g  Medications: see list  Supplementation: taking  CBG monitoring: not testing  Average CBG per patient: not testing Last patient reported A1c: will get an Hgb A1c this week   Using straws: Yes Drinking while eating: No Hair loss: No Carbonated beverages: No N/V/D/C: vomiting with chicken and beef Dumping syndrome: No  Recent physical activity:  Goes the gym 3 x week and works out for 60 minutes with a trainer (weight and cardio)  Progress Towards Goal(s):  In progress.  Handouts given during visit include:  Phase 3B High Protein + Non Starchy Vegetables  Post Op Meal Plan  High Protein Snacks   Nutritional Diagnosis:   Harrison-3.3 Overweight/obesity related to past poor dietary habits and physical inactivity as evidenced by patient w/ recent RYGB surgery following dietary guidelines for continued weight loss.    Intervention:  Nutrition education/diet advancment. Goals:  Follow Phase 3B: High Protein + Non-Starchy Vegetables  Eat 3-6 small meals/snacks, every 3-5 hrs  Increase lean protein foods to meet 60g goal (yogurt, eggs, beans, Malawiturkey sausage/bacon)  Increase fluid intake to 64oz +  Avoid drinking 15 minutes before, during and 30 minutes after eating  Aim for >30 min of physical activity daily  Have 1-2 Premier Protein shakes per day  Limit fruit and crackers for now  Teaching Method Utilized:  Visual Auditory Hands on  Barriers to learning/adherence to lifestyle change: none  Demonstrated degree of understanding via:  Teach Back   Monitoring/Evaluation:  Dietary intake, exercise, and body weight. Follow up in 1 months for 3 month post-op visit.

## 2014-05-16 NOTE — Patient Instructions (Addendum)
Goals:  Follow Phase 3B: High Protein + Non-Starchy Vegetables  Eat 3-6 small meals/snacks, every 3-5 hrs  Increase lean protein foods to meet 60g goal (yogurt, eggs, beans, [place] sausage/bacon)  Increase fluid intake to 64oz +  Avoid drinking 15 minutes before, during and 30 minutes after eating  Aim for >30 min of physical activity daily  Have 1-2 Premier Protein shakes per day  Limit fruit and crackers for now

## 2014-06-13 LAB — IRON AND TIBC
%SAT: 9 % — AB (ref 20–55)
IRON: 28 ug/dL — AB (ref 42–145)
TIBC: 327 ug/dL (ref 250–470)
UIBC: 299 ug/dL (ref 125–400)

## 2014-06-13 LAB — CBC WITH DIFFERENTIAL/PLATELET
BASOS PCT: 0 % (ref 0–1)
Basophils Absolute: 0 10*3/uL (ref 0.0–0.1)
EOS ABS: 0.2 10*3/uL (ref 0.0–0.7)
Eosinophils Relative: 2 % (ref 0–5)
HEMATOCRIT: 33.1 % — AB (ref 36.0–46.0)
Hemoglobin: 10.3 g/dL — ABNORMAL LOW (ref 12.0–15.0)
Lymphocytes Relative: 17 % (ref 12–46)
Lymphs Abs: 1.4 10*3/uL (ref 0.7–4.0)
MCH: 23.1 pg — AB (ref 26.0–34.0)
MCHC: 31.1 g/dL (ref 30.0–36.0)
MCV: 74.2 fL — AB (ref 78.0–100.0)
Monocytes Absolute: 0.6 10*3/uL (ref 0.1–1.0)
Monocytes Relative: 7 % (ref 3–12)
NEUTROS PCT: 74 % (ref 43–77)
Neutro Abs: 6.1 10*3/uL (ref 1.7–7.7)
PLATELETS: 339 10*3/uL (ref 150–400)
RBC: 4.46 MIL/uL (ref 3.87–5.11)
RDW: 18.9 % — ABNORMAL HIGH (ref 11.5–15.5)
WBC: 8.2 10*3/uL (ref 4.0–10.5)

## 2014-06-13 LAB — COMPLETE METABOLIC PANEL WITH GFR
ALK PHOS: 82 U/L (ref 39–117)
ALT: 23 U/L (ref 0–35)
AST: 24 U/L (ref 0–37)
Albumin: 3.6 g/dL (ref 3.5–5.2)
BILIRUBIN TOTAL: 0.6 mg/dL (ref 0.2–1.2)
BUN: 13 mg/dL (ref 6–23)
CO2: 22 meq/L (ref 19–32)
Calcium: 9.4 mg/dL (ref 8.4–10.5)
Chloride: 105 mEq/L (ref 96–112)
Creat: 0.87 mg/dL (ref 0.50–1.10)
GFR, Est African American: >89 mL/min
GFR, Est Non African American: 84 mL/min
GLUCOSE: 84 mg/dL (ref 70–99)
Potassium: 4 mEq/L (ref 3.5–5.3)
Sodium: 141 mEq/L (ref 135–145)
TOTAL PROTEIN: 7 g/dL (ref 6.0–8.3)

## 2014-06-13 LAB — MAGNESIUM: MAGNESIUM: 1.6 mg/dL (ref 1.5–2.5)

## 2014-06-13 LAB — FOLATE: Folate: 6.6 ng/mL

## 2014-06-13 LAB — VITAMIN B12: VITAMIN B 12: 600 pg/mL (ref 211–911)

## 2014-06-13 LAB — CHOLESTEROL, TOTAL: CHOLESTEROL: 128 mg/dL (ref 0–200)

## 2014-06-20 ENCOUNTER — Encounter: Payer: 59 | Attending: Surgery | Admitting: Dietician

## 2014-06-20 DIAGNOSIS — Z713 Dietary counseling and surveillance: Secondary | ICD-10-CM | POA: Diagnosis not present

## 2014-06-20 DIAGNOSIS — E119 Type 2 diabetes mellitus without complications: Secondary | ICD-10-CM | POA: Insufficient documentation

## 2014-06-20 NOTE — Progress Notes (Signed)
  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  Primary concerns today: Post-operative Bariatric Surgery Nutrition Management. Neysa BonitoChristy returns with an 18.5 lb weight loss. Overall doing well though craving fruit.  Lab work indicated that her iron level is low. She agreed to take MVI regularly and add an iron pill.   Surgery date: 03/19/2014 Surgery type: RYGB [facility] 252 lbs on 01/09/2014 Weight today: 186.5  Weight change: 18.5 lbs Total weight loss: 65.5 lbs Goal: 115-120 lbs  TANITA  BODY COMP RESULTS  02/26/14 04/03/14 05/16/14 06/20/14   BMI (kg/m^2) 48.8 45.5 40.0 36.4   Fat Mass (lbs) 128.0 110.5 85.0 74.0   Fat Free Mass (lbs) 122.0 122.5 120.0 112.5   Total Body Water (lbs) 89.5 89.5 88.0 82.5    Preferred Learning Style:   No preference indicated   Learning Readiness:   Ready  24-hr recall: B (AM): egg and small piece of bacon (10g) Snk (AM): cucumber, pickle, or cheese (0-6g) L (PM): Caesar salad with 2-3 oz ham, [place], cheese and bacon (14-21 g) Snk (PM): cucumber, pickle, or cheese (0-6g) D (PM): 3 oz meatloaf or chicken or pork chop (21 g) Snk (PM): might have SF popsicle  Fluid intake: 40 oz water  Estimated total protein intake: 45-58g  Medications: see list  Supplementation: forgets sometimes  CBG monitoring: not testing  Average CBG per patient: not testing Last patient reported A1c: has not had a Hgb A1c test recently  Using straws: No Drinking while eating: No Hair loss: No Carbonated beverages: No N/V/D/C: Thew up with fruit and yogurt at [employer] Dumping syndrome: No  Recent physical activity:  Goes the gym 3 x week and works out for 60 minutes with a trainer (weight and cardio)  Progress Towards Goal(s):  In progress.   Nutritional Diagnosis:  [place]-3.3 Overweight/obesity related to past poor dietary habits and physical inactivity as  evidenced by patient w/ recent RYGB surgery following dietary guidelines for continued weight loss.    Intervention:  Nutrition education/diet advancment. Goals:  Follow Phase 3B: High Protein + Non-Starchy Vegetables  Eat 3-6 small meals/snacks, every 3-5 hrs  Increase lean protein foods to meet 60g goal aim for 3 oz (deck of cards size protein at lunch and dinner)  Have some cheese with pickle for snack  Increase fluid intake to 64oz +, try diet lemonade or lemon/fruit in water  Avoid drinking 15 minutes before, during and 30 minutes after eating  Aim for >30 min of physical activity daily  Make sure you your vitamins, add an iron pill to take with multivitamin   Take vitamins and calcium with each meal and snack  Add one small piece of fruit with breakfast protein   Teaching Method Utilized:  Visual Auditory Hands on  Barriers to learning/adherence to lifestyle change: none  Demonstrated degree of understanding via:  Teach Back   Monitoring/Evaluation:  Dietary intake, exercise, and body weight. Follow up in 3 months for 6 month post-op visit.

## 2014-06-20 NOTE — Patient Instructions (Addendum)
Goals:  Follow Phase 3B: High Protein + Non-Starchy Vegetables  Eat 3-6 small meals/snacks, every 3-5 hrs  Increase lean protein foods to meet 60g goal aim for 3 oz (deck of cards size protein at lunch and dinner)  Have some cheese with pickle for snack  Increase fluid intake to 64oz +, try diet lemonade or lemon/fruit in water  Avoid drinking 15 minutes before, during and 30 minutes after eating  Aim for >30 min of physical activity daily  Make sure you your vitamins, add an iron pill to take with multivitamin   Take vitamins and calcium with each meal and snack  Add one small piece of fruit with breakfast protein

## 2014-09-17 ENCOUNTER — Ambulatory Visit: Payer: 59 | Admitting: Dietician

## 2014-10-01 ENCOUNTER — Ambulatory Visit: Payer: 59 | Admitting: Dietician

## 2014-10-05 ENCOUNTER — Encounter: Payer: 59 | Attending: Surgery | Admitting: Dietician

## 2014-10-05 ENCOUNTER — Encounter: Payer: Self-pay | Admitting: Dietician

## 2014-10-05 ENCOUNTER — Ambulatory Visit: Payer: 59 | Admitting: Dietician

## 2014-10-05 DIAGNOSIS — Z6830 Body mass index (BMI) 30.0-30.9, adult: Secondary | ICD-10-CM | POA: Insufficient documentation

## 2014-10-05 DIAGNOSIS — Z713 Dietary counseling and surveillance: Secondary | ICD-10-CM | POA: Diagnosis not present

## 2014-10-05 NOTE — Progress Notes (Signed)
  Follow-up visit:  6 Months Post-Operative RYGB Surgery  Medical Nutrition Therapy:  Appt start time: 1010 end time:  1030.  Primary concerns today: Post-operative Bariatric Surgery Nutrition Management. Paige BonitoChristy returns with an 31 lb weight loss. Overall doing well and working out with a Psychologist, educationaltrainer. Does a lot of weight training.   Lab work indicated that her iron level is low. Agreed to add additional iron pill.   Body fat % is 31.5%.  Surgery date: 03/19/2014 Surgery type: [facility]YGB [facility] lbs on 01/09/2014 Weight today: 155.5 lbs  Weight change: 31 lbs Total weight loss: 96.5 lbs Goal: 115-120 lbs  TANITA  BODY COMP RESULTS  02/26/14 04/03/14 05/16/14 06/20/14 10/05/14   BMI (kg/m^2) 48.8 45.5 40.0 36.4 30.4   Fat Mass (lbs) 128.0 110.5 85.0 74.0 49.0   Fat Free Mass (lbs) 122.0 122.5 120.0 112.5 106.5   Total Body Water (lbs) 89.5 89.5 88.0 82.5 78.0    Preferred Learning Style:   No preference indicated   Learning Readiness:   Ready  24-hr recall: B (AM): 1.5-2 egg (10-12g) Snk (AM): cucumber, pickle, or cheese (0-6g) L (PM): Caesar salad with 2-3 oz ham, [place], cheese and bacon (14-21 g) Snk (PM): cucumber, pickle, or cheese (0-6g) D (PM): 3 oz meatloaf or chicken or pork chop with vegetable (21 g) Snk (PM): might have SF popsicle  Fluid intake: 12 oz orange juice, 6 bottles per day water  Estimated total protein intake: 45-58g  Medications: see list  Supplementation: taking  CBG monitoring: not testing  Average CBG per patient: not testing Last patient reported A1c: not sure of last Hgb A1c (states is in in pre-diabetes range)  Using straws: No Drinking while eating: No Hair loss: has lost some Carbonated beverages: No N/V/D/C: No Dumping syndrome: No  Recent physical activity:  Goes the gym 3 x week and works out for 60 minutes with a trainer, walks other days of the week (weight and cardio)  Progress Towards Goal(s):  In  progress.   Nutritional Diagnosis:  [place]-3.3 Overweight/obesity related to past poor dietary habits and physical inactivity as evidenced by patient w/ recent RYGB surgery following dietary guidelines for continued weight loss.    Intervention:  Nutrition education/diet reinforcement Goals:  Follow Phase 3B: High Protein + Non-Starchy Vegetables  Eat 3-6 small meals/snacks, every 3-5 hrs  Increase lean protein foods to meet 60g goal aim for 3 oz (deck of cards size protein at lunch and dinner)  Add muscle milk protein shake  Have some cheese with pickle for snack  Increase fluid intake to 64oz +, try diet lemonade or lemon/fruit in water  Avoid drinking 15 minutes before, during and 30 minutes after eating  Aim for >30 min of physical activity daily  Make sure you your vitamins, add an iron pill to take with multivitamin   Keep carbs at 15-20 g per meal or snacks  Supplement given out during appointment: Bariatric advantage iron chewy bit, lot #[id number], exp: 12/2014   Teaching Method Utilized:  Visual Auditory Hands on  Barriers to learning/adherence to lifestyle change: none  Demonstrated degree of understanding via:  Teach Back   Monitoring/Evaluation:  Dietary intake, exercise, and body weight. Follow up in 3 months for 9 month post-op visit.

## 2014-10-05 NOTE — Patient Instructions (Addendum)
Goals:  Follow Phase 3B: High Protein + Non-Starchy Vegetables  Eat 3-6 small meals/snacks, every 3-5 hrs  Increase lean protein foods to meet 60g goal aim for 3 oz (deck of cards size protein at lunch and dinner)  Add muscle milk protein shake  Have some cheese with pickle for snack  Increase fluid intake to 64oz +, try diet lemonade or lemon/fruit in water  Avoid drinking 15 minutes before, during and 30 minutes after eating  Aim for >30 min of physical activity daily  Make sure you your vitamins, add an iron pill to take with multivitamin   Keep carbs at 15-20 g per meal or snacks  Surgery date: 03/19/2014 Surgery type: RYGB [facility]: 252 lbs on 01/09/2014 Weight today: 155.5 lbs  Weight change: 31 lbs Total weight loss: 96.5 lbs Goal: 115-120 lbs  TANITA  BODY COMP RESULTS  02/26/14 04/03/14 05/16/14 06/20/14 10/05/14   BMI (kg/m^2) 48.8 45.5 40.0 36.4 30.4   Fat Mass (lbs) 128.0 110.5 85.0 74.0 49.0   Fat Free Mass (lbs) 122.0 122.5 120.0 112.5 106.5   Total Body Water (lbs) 89.5 89.5 88.0 82.5 78.0

## 2015-01-10 ENCOUNTER — Ambulatory Visit: Payer: 59 | Admitting: Dietician

## 2015-01-31 IMAGING — RF DG UGI W/ GASTROGRAFIN
4 series · 4 of 4 positions shown · IV contrast (omnipaque)
Comparison: 12/27/2013

FLUOROSCOPY TIME:  33 seconds

CLINICAL DATA: Postop gastric bypass

EXAM:
WATER SOLUBLE UPPER GI SERIES
TECHNIQUE: Single-column upper GI series was performed using water soluble
contrast.
CONTRAST:  10mL OMNIPAQUE IOHEXOL 300 MG/ML  SOLN

[Series 1: run · 1 of 1 slices shown (1 of 3)]
[im 1/1]
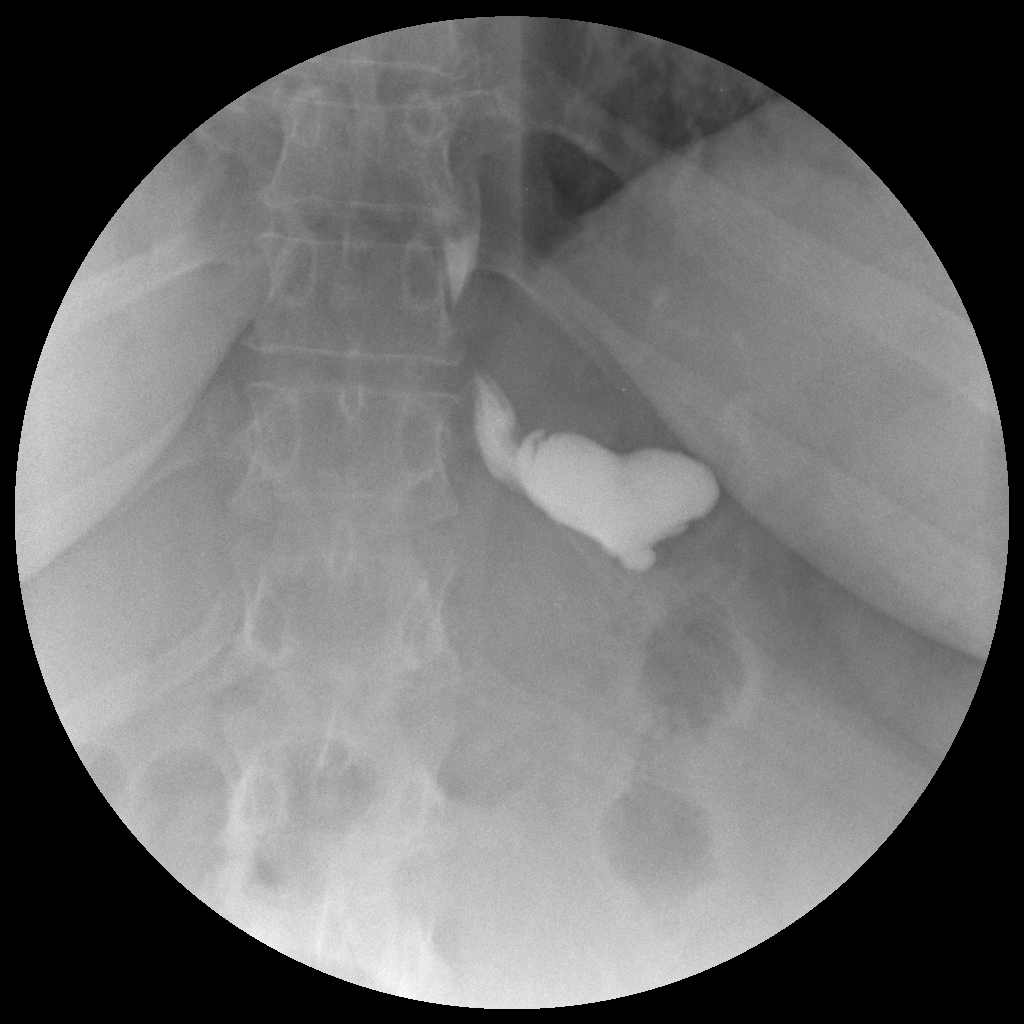

[Series 2: run · 1 of 1 slices shown (2 of 3)]
[im 1/1]
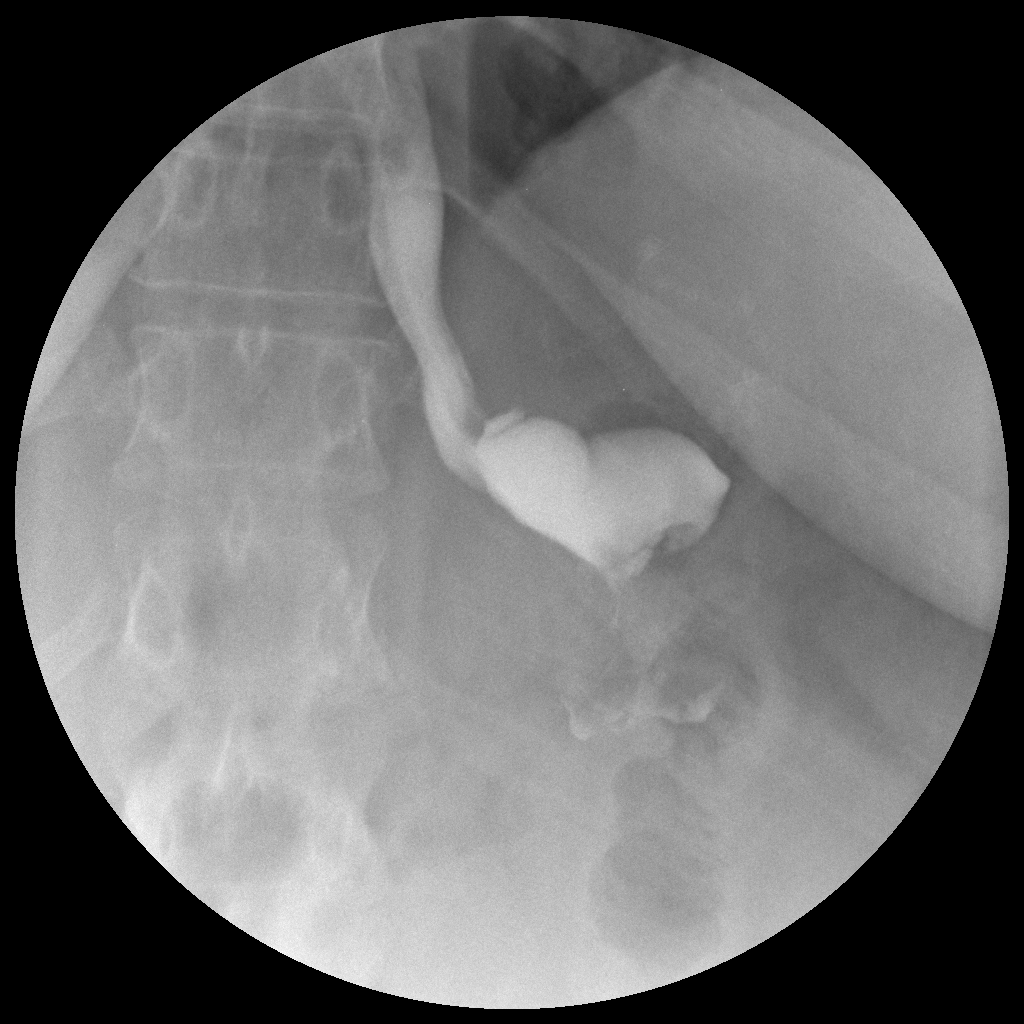

[Series 3: run · 1 of 1 slices shown (3 of 3)]
[im 1/1]
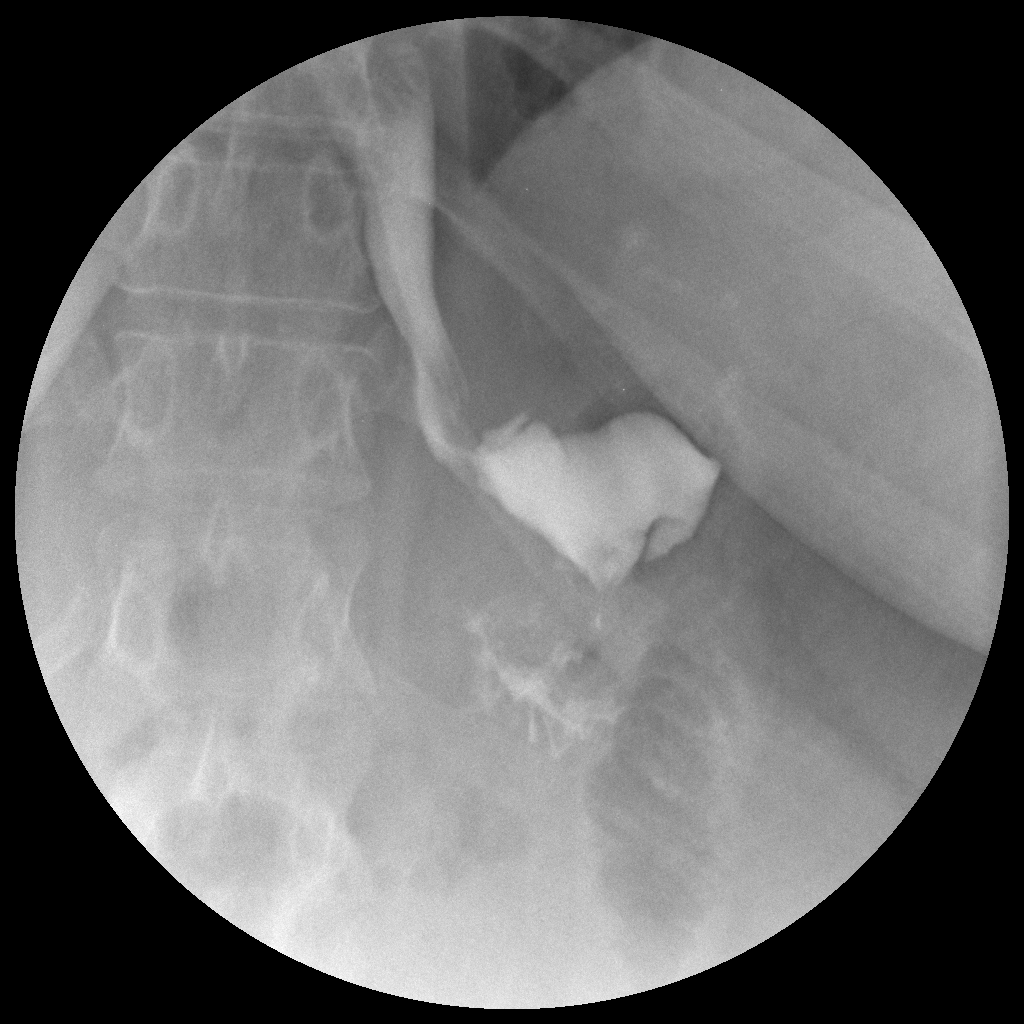

[Series 1001: view not recorded · 0.20mm/px · 1 of 1 slices shown]
[im 1/1]
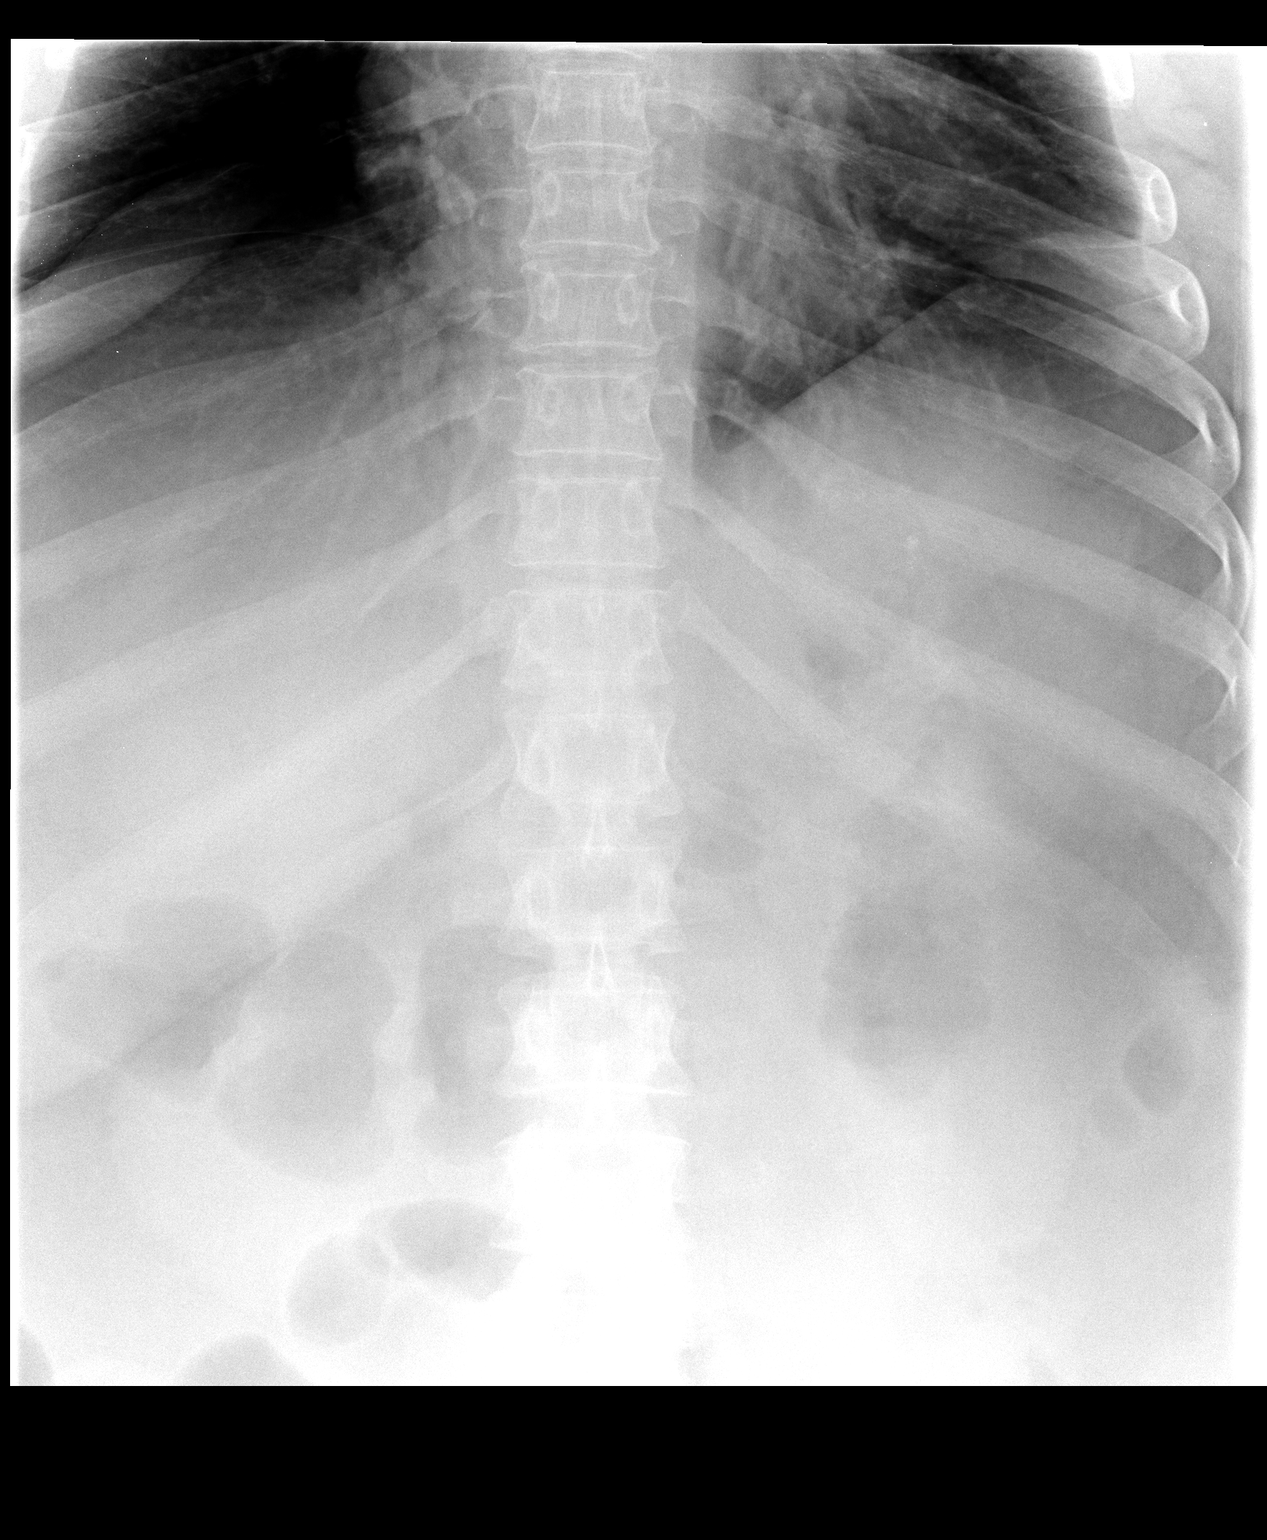

[4 of 4 positions shown; findings below may reference images not displayed]

FINDINGS: Scout radiograph demonstrates surgical sutures in the left upper
abdomen.

Normal gastric pouch.  No evidence of leak.

Narrowing at the gastrojejunostomy, likely reflecting postoperative
edema. Some contrast does pass into the jejunum after a brief delay.

Moderate gastroesophageal reflux.
IMPRESSION: No evidence of leak.

Narrowing at the gastrojejunostomy, likely reflecting postoperative
edema, although patent.

Moderate gastroesophageal reflux.

## 2015-02-04 ENCOUNTER — Encounter: Payer: Self-pay | Admitting: Dietician

## 2015-02-04 ENCOUNTER — Encounter: Payer: 59 | Attending: Surgery | Admitting: Dietician

## 2015-02-04 DIAGNOSIS — Z713 Dietary counseling and surveillance: Secondary | ICD-10-CM | POA: Insufficient documentation

## 2015-02-04 DIAGNOSIS — Z6827 Body mass index (BMI) 27.0-27.9, adult: Secondary | ICD-10-CM | POA: Diagnosis not present

## 2015-02-04 NOTE — Progress Notes (Signed)
  Follow-up visit:  10.5 Months Post-Operative RYGB Surgery  Medical Nutrition Therapy:  Appt start time: 1130 end time:  1200  Primary concerns today: Post-operative Bariatric Surgery Nutrition Management. Paige Anderson returns with an 15.5 lb weight loss. Overall doing well and working out with a Psychologist, educational. Does a lot of weight training. Steps on scale 1 x week.   Body fat % is 22.6%. Would like to get weight down to 115 since that was the weight she was when she had her daughter. Encouraged her to stop losing weight since her body fat is normal.   Surgery date: 03/19/2014 Surgery type: RYGB Start weight at [facility]: 252 lbs on 01/09/2014 Weight today: 140.0 lbs  Weight change: 15.5 lbs Total weight loss: 112 lbs Goal: 115-120 lbs  TANITA  BODY COMP RESULTS  02/26/14 04/03/14 05/16/14 06/20/14 10/05/14 02/04/15   BMI (kg/m^2) 48.8 45.5 40.0 36.4 30.4 27.3   Fat Mass (lbs) 128.0 110.5 85.0 74.0 49.0 31.5   Fat Free Mass (lbs) 122.0 122.5 120.0 112.5 106.5 108.5   Total Body Water (lbs) 89.5 89.5 88.0 82.5 78.0 79.5    Preferred Learning Style:   No preference indicated   Learning Readiness:   Ready  24-hr recall: B (AM): pancake   Snk (AM): cucumber, pickle, or cheese (0-6g) L (PM): Caesar salad with 2-3 oz ham, [place], cheese and bacon, or [place] sandwich  (14-21 g) Snk (PM): cucumber, pickle, or cheese (0-6g) D (PM): 3 oz meatloaf or chicken or pork chop with vegetable (21 g) Snk (PM): might have SF popsicle or fresh fruit   Fluid intake: 12 oz orange juice, 6 bottles per day water, diet lemonade   Estimated total protein intake: 45-58g  Medications: see list  Supplementation: taking  CBG monitoring: not testing  Average CBG per patient: not testing Last patient reported A1c: not sure of last Hgb A1c (states is in in pre-diabetes range)  Using straws: No Drinking while eating: No Hair loss: No Carbonated beverages: No N/V/D/C: No Dumping syndrome: No  Recent physical  activity:  Goes the gym 3 x week and works out for 60 minutes with a trainer, walks other days of the week (weight and cardio)  Progress Towards Goal(s):  In progress.   Nutritional Diagnosis:  [place]-3.3 Overweight/obesity related to past poor dietary habits and physical inactivity as evidenced by patient w/ recent RYGB surgery following dietary guidelines for continued weight loss.    Intervention:  Nutrition education/diet reinforcement Goals:  Follow Phase 3B: High Protein + Non-Starchy Vegetables  Eat 3-6 small meals/snacks, every 3-5 hrs  Add egg or bacon to pancake in morning  Increase lean protein foods to meet 60g goal aim for 3 oz (deck of cards size protein at lunch and dinner)  Think about adding muscle milk protein shake  Increase fluid intake to 64oz +, try diet lemonade or lemon/fruit in water  Avoid drinking 15 minutes before, during and 30 minutes after eating  Aim for >30 min of physical activity daily  Teaching Method Utilized:  Visual Auditory Hands on  Barriers to learning/adherence to lifestyle change: none  Demonstrated degree of understanding via:  Teach Back   Monitoring/Evaluation:  Dietary intake, exercise, and body weight. Follow up in 3 months for 13.5 month post-op visit.

## 2015-02-04 NOTE — Patient Instructions (Addendum)
Goals:  Follow Phase 3B: High Protein + Non-Starchy Vegetables  Eat 3-6 small meals/snacks, every 3-5 hrs  Add egg or bacon to pancake in morning  Increase lean protein foods to meet 60g goal aim for 3 oz (deck of cards size protein at lunch and dinner)  Think about adding muscle milk protein shake  Increase fluid intake to 64oz +, try diet lemonade or lemon/fruit in water  Avoid drinking 15 minutes before, during and 30 minutes after eating  Aim for >30 min of physical activity daily  Body fat % is 22.6%.  Surgery date: 03/19/2014 Surgery type: RYGB Start weight at [facility]: 252 lbs on 01/09/2014 Weight today: 140.0 lbs  Weight change: 15.5 lbs Total weight loss: 112 lbs   TANITA  BODY COMP RESULTS  02/26/14 04/03/14 05/16/14 06/20/14 10/05/14 02/04/15   BMI (kg/m^2) 48.8 45.5 40.0 36.4 30.4 27.3   Fat Mass (lbs) 128.0 110.5 85.0 74.0 49.0 31.5   Fat Free Mass (lbs) 122.0 122.5 120.0 112.5 106.5 108.5   Total Body Water (lbs) 89.5 89.5 88.0 82.5 78.0 79.5

## 2015-05-06 ENCOUNTER — Encounter: Payer: 59 | Attending: Surgery | Admitting: Dietician

## 2015-05-06 ENCOUNTER — Encounter: Payer: Self-pay | Admitting: Dietician

## 2015-05-06 DIAGNOSIS — Z713 Dietary counseling and surveillance: Secondary | ICD-10-CM | POA: Insufficient documentation

## 2015-05-06 DIAGNOSIS — Z6827 Body mass index (BMI) 27.0-27.9, adult: Secondary | ICD-10-CM | POA: Insufficient documentation

## 2015-05-06 NOTE — Progress Notes (Signed)
  Follow-up visit:  13 Months Post-Operative RYGB Surgery  Medical Nutrition Therapy:  Appt start time: 840 end time:  910  Primary concerns today: Post-operative Bariatric Surgery Nutrition Management. Paige Anderson returns with an 5.5 lb weight loss. Has started drinking 1-2 coffees with 2 tablespoons of sugar and flavored creamer. Still working out with her trainer and weighing herself 1 x week.   Body fat % is 26.2%. Explained the the lowest healthy weight for her would be 128 lbs.   Surgery date: 03/19/2014 Surgery type: RYGB [facility] lbs on 01/09/2014 Weight today: 134.5 lbs   Weight change: 5.5 lbs Total weight loss: 117.5 lbs Goal: 115-120 lbs  TANITA  BODY COMP RESULTS  02/26/14 04/03/14 05/16/14 06/20/14 10/05/14 02/04/15 05/06/15   BMI (kg/m^2) 48.8 45.5 40.0 36.4 30.4 27.3 26.3   Fat Mass (lbs) 128.0 110.5 85.0 74.0 49.0 31.5 36.0   Fat Free Mass (lbs) 122.0 122.5 120.0 112.5 106.5 108.5 98.5   Total Body Water (lbs) 89.5 89.5 88.0 82.5 78.0 79.5 72.0    Preferred Learning Style:   No preference indicated   Learning Readiness:   Ready  24-hr recall: B (AM): 1/2 egg and cheese biscuit and coffee (6 g)   Snk (AM): fruit  (0g) L (PM): Caesar salad with 2-3 oz ham, [place], cheese and bacon, or [place] sandwich  (14-21 g) Snk (PM): fruit or cheese (0-6g) D (PM): 3 oz meatloaf or chicken or pork chop with vegetable (21 g) Snk (PM): fruit  Fluid intake: 6 bottles per day water, coffee with sugar and 1 tablespoon of hazelnut creamer Estimated total protein intake: 41-47 g   Medications: see list  Supplementation: taking  CBG monitoring: not testing  Average CBG per patient: not testing Last patient reported A1c: not sure of last Hgb A1c (states is in in pre-diabetes range)  Using straws: No Drinking while eating: No Hair loss: No Carbonated beverages: No N/V/D/C: No Dumping syndrome: No  Recent physical activity:  Goes the gym 3 x week and works out  for 60 minutes with a trainer, walks other days of the week (weight and cardio)  Progress Towards Goal(s):  In progress.   Nutritional Diagnosis:  [place]-3.3 Overweight/obesity related to past poor dietary habits and physical inactivity as evidenced by patient w/ recent RYGB surgery following dietary guidelines for continued weight loss.    Intervention:  Nutrition education/diet reinforcement Goals:  Follow Phase 3B: High Protein + Non-Starchy Vegetables  Eat 3-6 small meals/snacks, every 3-5 hrs  Add a protein shake for a snack instead of fruit  Cut back on sugar in coffee or try Stevia   Increase fluid intake to 64oz   Avoid drinking 15 minutes before, during and 30 minutes after eating  Aim for >30 min of physical activity daily  Teaching Method Utilized:  Visual Auditory Hands on  Barriers to learning/adherence to lifestyle change: none  Demonstrated degree of understanding via:  Teach Back   Monitoring/Evaluation:  Dietary intake, exercise, and body weight. Follow up in 4 months for 17 month post-op visit.

## 2015-05-06 NOTE — Patient Instructions (Addendum)
Goals:  Follow Phase 3B: High Protein + Non-Starchy Vegetables  Eat 3-6 small meals/snacks, every 3-5 hrs  Add a protein shake for a snack instead of fruit  Cut back on sugar in coffee or try Stevia   Increase fluid intake to 64oz   Avoid drinking 15 minutes before, during and 30 minutes after eating  Aim for >30 min of physical activity daily   Surgery date: 03/19/2014 Surgery type: RYGB [facility] lbs on 01/09/2014 Weight today: 134.5 lbs   Weight change: 5.5 lbs Total weight loss: 117.5 lbs Goal: 115-120 lbs  TANITA  BODY COMP RESULTS  02/26/14 04/03/14 05/16/14 06/20/14 10/05/14 02/04/15 05/06/15   BMI (kg/m^2) 48.8 45.5 40.0 36.4 30.4 27.3 26.3   Fat Mass (lbs) 128.0 110.5 85.0 74.0 49.0 31.5 36.0   Fat Free Mass (lbs) 122.0 122.5 120.0 112.5 106.5 108.5 98.5   Total Body Water (lbs) 89.5 89.5 88.0 82.5 78.0 79.5 72.0

## 2015-09-05 ENCOUNTER — Ambulatory Visit: Payer: 59 | Admitting: Dietician

## 2016-02-26 ENCOUNTER — Encounter (HOSPITAL_COMMUNITY): Payer: Self-pay

## 2016-10-21 ENCOUNTER — Encounter (HOSPITAL_COMMUNITY): Payer: Self-pay
